# Patient Record
Sex: Male | Born: 1965 | Race: White | Hispanic: No | Marital: Married | Smoking: Never smoker
Health system: Southern US, Community
[De-identification: ages and names within clinical notes are randomized; demographics above are authoritative.]

## PROBLEM LIST (undated history)

## (undated) DIAGNOSIS — L219 Seborrheic dermatitis, unspecified: Secondary | ICD-10-CM

## (undated) DIAGNOSIS — T783XXA Angioneurotic edema, initial encounter: Secondary | ICD-10-CM

## (undated) DIAGNOSIS — K862 Cyst of pancreas: Secondary | ICD-10-CM

## (undated) DIAGNOSIS — H9319 Tinnitus, unspecified ear: Secondary | ICD-10-CM

## (undated) DIAGNOSIS — K449 Diaphragmatic hernia without obstruction or gangrene: Secondary | ICD-10-CM

## (undated) HISTORY — DX: Seborrheic dermatitis, unspecified: L21.9

## (undated) HISTORY — DX: Diaphragmatic hernia without obstruction or gangrene: K44.9

## (undated) HISTORY — DX: Tinnitus, unspecified ear: H93.19

## (undated) HISTORY — DX: Cyst of pancreas: K86.2

## (undated) HISTORY — PX: OTHER SURGICAL HISTORY: SHX169

## (undated) HISTORY — DX: Angioneurotic edema, initial encounter: T78.3XXA

---
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
## 1996-06-19 HISTORY — PX: LAPAROSCOPIC NISSEN FUNDOPLICATION: SHX1932

## 1997-06-19 HISTORY — PX: VASECTOMY: SHX75

## 2020-02-26 ENCOUNTER — Other Ambulatory Visit: Payer: Self-pay

## 2020-02-26 ENCOUNTER — Encounter (HOSPITAL_COMMUNITY): Payer: Self-pay | Admitting: Emergency Medicine

## 2020-02-26 ENCOUNTER — Emergency Department (HOSPITAL_COMMUNITY)

## 2020-02-26 ENCOUNTER — Inpatient Hospital Stay (HOSPITAL_COMMUNITY)
Admission: EM | Admit: 2020-02-26 | Discharge: 2020-02-29 | DRG: 418 | Disposition: A | Discharge status: Home / Self Care | Attending: Surgery | Admitting: Surgery

## 2020-02-26 DIAGNOSIS — K219 Gastro-esophageal reflux disease without esophagitis: Secondary | ICD-10-CM | POA: Diagnosis present

## 2020-02-26 DIAGNOSIS — K449 Diaphragmatic hernia without obstruction or gangrene: Secondary | ICD-10-CM | POA: Diagnosis present

## 2020-02-26 DIAGNOSIS — I10 Essential (primary) hypertension: Secondary | ICD-10-CM | POA: Diagnosis not present

## 2020-02-26 DIAGNOSIS — Z20822 Contact with and (suspected) exposure to covid-19: Secondary | ICD-10-CM | POA: Diagnosis present

## 2020-02-26 DIAGNOSIS — K760 Fatty (change of) liver, not elsewhere classified: Secondary | ICD-10-CM | POA: Diagnosis not present

## 2020-02-26 DIAGNOSIS — K660 Peritoneal adhesions (postprocedural) (postinfection): Secondary | ICD-10-CM | POA: Diagnosis not present

## 2020-02-26 DIAGNOSIS — K8050 Calculus of bile duct without cholangitis or cholecystitis without obstruction: Secondary | ICD-10-CM

## 2020-02-26 DIAGNOSIS — K810 Acute cholecystitis: Secondary | ICD-10-CM

## 2020-02-26 DIAGNOSIS — Z419 Encounter for procedure for purposes other than remedying health state, unspecified: Secondary | ICD-10-CM

## 2020-02-26 DIAGNOSIS — R109 Unspecified abdominal pain: Secondary | ICD-10-CM

## 2020-02-26 DIAGNOSIS — Z880 Allergy status to penicillin: Secondary | ICD-10-CM

## 2020-02-26 DIAGNOSIS — K8062 Calculus of gallbladder and bile duct with acute cholecystitis without obstruction: Principal | ICD-10-CM | POA: Diagnosis present

## 2020-02-26 DIAGNOSIS — K862 Cyst of pancreas: Secondary | ICD-10-CM | POA: Diagnosis not present

## 2020-02-26 DIAGNOSIS — K8000 Calculus of gallbladder with acute cholecystitis without obstruction: Secondary | ICD-10-CM | POA: Diagnosis present

## 2020-02-26 DIAGNOSIS — K819 Cholecystitis, unspecified: Secondary | ICD-10-CM | POA: Diagnosis present

## 2020-02-26 DIAGNOSIS — R1011 Right upper quadrant pain: Secondary | ICD-10-CM

## 2020-02-26 LAB — COMPREHENSIVE METABOLIC PANEL
ALT: 18 U/L (ref 0–44)
AST: 12 U/L — ABNORMAL LOW (ref 15–41)
Albumin: 3.7 g/dL (ref 3.5–5.0)
Alkaline Phosphatase: 70 U/L (ref 38–126)
Anion gap: 11 (ref 5–15)
BUN: 16 mg/dL (ref 6–20)
CO2: 24 mmol/L (ref 22–32)
Calcium: 9 mg/dL (ref 8.9–10.3)
Chloride: 98 mmol/L (ref 98–111)
Creatinine, Ser: 0.87 mg/dL (ref 0.61–1.24)
GFR calc Af Amer: >60 mL/min (ref >60)
GFR calc non Af Amer: >60 mL/min (ref >60)
Glucose, Bld: 101 mg/dL — ABNORMAL HIGH (ref 70–99)
Potassium: 3.7 mmol/L (ref 3.5–5.1)
Sodium: 133 mmol/L — ABNORMAL LOW (ref 135–145)
Total Bilirubin: 1.6 mg/dL — ABNORMAL HIGH (ref 0.3–1.2)
Total Protein: 7.4 g/dL (ref 6.5–8.1)

## 2020-02-26 LAB — LACTIC ACID, PLASMA
Lactic Acid, Venous: 1.4 mmol/L (ref 0.5–1.9)
Lactic Acid, Venous: 1 mmol/L (ref 0.5–1.9)

## 2020-02-26 LAB — URINALYSIS, ROUTINE W REFLEX MICROSCOPIC
Bilirubin Urine: NEGATIVE
Glucose, UA: NEGATIVE mg/dL
Hgb urine dipstick: NEGATIVE
Ketones, ur: 5 mg/dL — AB
Leukocytes,Ua: NEGATIVE
Nitrite: NEGATIVE
Protein, ur: 30 mg/dL — AB
Specific Gravity, Urine: 1.026 (ref 1.005–1.030)
pH: 5 (ref 5.0–8.0)

## 2020-02-26 LAB — CBC
HCT: 47.6 % (ref 39.0–52.0)
Hemoglobin: 16.9 g/dL (ref 13.0–17.0)
MCH: 33.3 pg (ref 26.0–34.0)
MCHC: 35.5 g/dL (ref 30.0–36.0)
MCV: 93.9 fL (ref 80.0–100.0)
Platelets: 279 10*3/uL (ref 150–400)
RBC: 5.07 MIL/uL (ref 4.22–5.81)
RDW: 12.5 % (ref 11.5–15.5)
WBC: 13.2 10*3/uL — ABNORMAL HIGH (ref 4.0–10.5)
nRBC: 0 % (ref 0.0–0.2)

## 2020-02-26 LAB — PROTIME-INR
INR: 1.1 (ref 0.8–1.2)
Prothrombin Time: 13.6 seconds (ref 11.4–15.2)

## 2020-02-26 LAB — HIV ANTIBODY (ROUTINE TESTING W REFLEX): HIV Screen 4th Generation wRfx: NONREACTIVE

## 2020-02-26 LAB — SURGICAL PCR SCREEN
MRSA, PCR: NEGATIVE
Staphylococcus aureus: NEGATIVE

## 2020-02-26 LAB — APTT: aPTT: 29 seconds (ref 24–36)

## 2020-02-26 LAB — SARS CORONAVIRUS 2 BY RT PCR (HOSPITAL ORDER, PERFORMED IN ~~LOC~~ HOSPITAL LAB): SARS Coronavirus 2: NEGATIVE

## 2020-02-26 LAB — LIPASE, BLOOD: Lipase: 20 U/L (ref 11–51)

## 2020-02-26 MED ORDER — LORATADINE 10 MG PO TABS
10.0000 mg | ORAL_TABLET | Freq: Every day | ORAL | Status: DC
  Administered 2020-02-27: 10 mg via ORAL
  Filled 2020-02-26: qty 1

## 2020-02-26 MED ORDER — LORATADINE 10 MG PO TABS: 10.0000 mg | ORAL_TABLET | Freq: Every day | ORAL | Status: DC

## 2020-02-26 MED ORDER — HYDRALAZINE HCL 10 MG PO TABS
10.0000 mg | ORAL_TABLET | Freq: Four times a day (QID) | ORAL | Status: DC | PRN
  Filled 2020-02-26: qty 1

## 2020-02-26 MED ORDER — IBUPROFEN 400 MG PO TABS: 600.0000 mg | ORAL_TABLET | Freq: Four times a day (QID) | ORAL | Status: DC | PRN

## 2020-02-26 MED ORDER — DIPHENHYDRAMINE HCL 50 MG/ML IJ SOLN: 25.0000 mg | Freq: Four times a day (QID) | INTRAMUSCULAR | Status: DC | PRN

## 2020-02-26 MED ORDER — OXYCODONE HCL 5 MG PO TABS
5.0000 mg | ORAL_TABLET | Freq: Four times a day (QID) | ORAL | Status: DC | PRN
  Administered 2020-02-27: 5 mg via ORAL
  Filled 2020-02-26: qty 1

## 2020-02-26 MED ORDER — SODIUM CHLORIDE 0.9 % IV SOLN
2.0000 g | Freq: Once | INTRAVENOUS | Status: AC
  Administered 2020-02-26: 2 g via INTRAVENOUS
  Filled 2020-02-26: qty 20

## 2020-02-26 MED ORDER — MONTELUKAST SODIUM 10 MG PO TABS: 10.0000 mg | ORAL_TABLET | Freq: Every day | ORAL | Status: DC

## 2020-02-26 MED ORDER — KCL IN DEXTROSE-NACL 20-5-0.45 MEQ/L-%-% IV SOLN
INTRAVENOUS | Status: DC
  Filled 2020-02-26 (×3): qty 1000

## 2020-02-26 MED ORDER — ENOXAPARIN SODIUM 40 MG/0.4ML ~~LOC~~ SOLN: 40.0000 mg | SUBCUTANEOUS | Status: DC

## 2020-02-26 MED ORDER — ACETAMINOPHEN 325 MG PO TABS
650.0000 mg | ORAL_TABLET | Freq: Four times a day (QID) | ORAL | Status: DC | PRN
  Administered 2020-02-27: 650 mg via ORAL
  Filled 2020-02-26: qty 2

## 2020-02-26 MED ORDER — SODIUM CHLORIDE 0.9 % IV SOLN
2.0000 g | INTRAVENOUS | Status: DC
  Filled 2020-02-26: qty 20

## 2020-02-26 MED ORDER — MUPIROCIN 2 % EX OINT: 1.0000 "application " | TOPICAL_OINTMENT | Freq: Two times a day (BID) | CUTANEOUS | Status: DC

## 2020-02-26 MED ORDER — MORPHINE SULFATE (PF) 4 MG/ML IV SOLN
4.0000 mg | INTRAVENOUS | Status: DC | PRN
  Administered 2020-02-26: 4 mg via INTRAVENOUS
  Filled 2020-02-26: qty 1

## 2020-02-26 MED ORDER — SODIUM CHLORIDE 0.9 % IV BOLUS (SEPSIS)
1000.0000 mL | Freq: Once | INTRAVENOUS | Status: AC
  Administered 2020-02-26: 1000 mL via INTRAVENOUS

## 2020-02-26 MED ORDER — DIPHENHYDRAMINE HCL 25 MG PO CAPS: 25.0000 mg | ORAL_CAPSULE | Freq: Four times a day (QID) | ORAL | Status: DC | PRN

## 2020-02-26 MED ORDER — ACETAMINOPHEN 650 MG RE SUPP: 650.0000 mg | Freq: Four times a day (QID) | RECTAL | Status: DC | PRN

## 2020-02-26 MED ORDER — SODIUM CHLORIDE 0.9 % IV SOLN: 2.0000 g | INTRAVENOUS | Status: DC

## 2020-02-26 MED ORDER — ONDANSETRON HCL 4 MG/2ML IJ SOLN: 4.0000 mg | Freq: Four times a day (QID) | INTRAMUSCULAR | Status: DC | PRN

## 2020-02-26 MED ORDER — ONDANSETRON 4 MG PO TBDP: 4.0000 mg | ORAL_TABLET | Freq: Four times a day (QID) | ORAL | Status: DC | PRN

## 2020-02-26 NOTE — Anesthesia Preprocedure Evaluation (Addendum)
Anesthesia Evaluation  Patient identified by MRN, date of birth, ID band Patient awake    Reviewed: Allergy & Precautions, NPO status , Patient's Chart, lab work & pertinent test results  History of Anesthesia Complications Negative for: history of anesthetic complications  Airway Mallampati: II  TM Distance: >3 FB Neck ROM: Full    Dental no notable dental hx. (+) Dental Advisory Given   Pulmonary neg pulmonary ROS,    Pulmonary exam normal        Cardiovascular hypertension, Normal cardiovascular exam     Neuro/Psych negative neurological ROS     GI/Hepatic Neg liver ROS,   Endo/Other  negative endocrine ROS  Renal/GU negative Renal ROS     Musculoskeletal negative musculoskeletal ROS (+)   Abdominal   Peds  Hematology negative hematology ROS (+)   Anesthesia Other Findings   Reproductive/Obstetrics                            Anesthesia Physical Anesthesia Plan  ASA: II  Anesthesia Plan: General   Post-op Pain Management:    Induction: Intravenous  PONV Risk Score and Plan: 4 or greater and Ondansetron, Dexamethasone, Midazolam and Treatment may vary due to age or medical condition  Airway Management Planned: Oral ETT  Additional Equipment:   Intra-op Plan:   Post-operative Plan: Extubation in OR  Informed Consent: I have reviewed the patients History and Physical, chart, labs and discussed the procedure including the risks, benefits and alternatives for the proposed anesthesia with the patient or authorized representative who has indicated his/her understanding and acceptance.     Dental advisory given  Plan Discussed with: Anesthesiologist, CRNA and Surgeon  Anesthesia Plan Comments:        Anesthesia Quick Evaluation

## 2020-02-26 NOTE — ED Provider Notes (Signed)
[facility]-EMERGENCY DEPT Provider Note   CSN: [id number] Arrival date & time: 02/26/20  1856     History Chief Complaint  Patient presents with  . Abdominal Pain    Tim Russo is a 54 y.o. male.  The history is provided by the patient. No language interpreter was used.  Abdominal Pain  Tim Russo is a 54 y.o. male who presents to the Emergency Department complaining of abdominal pain. 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 On day one of symptoms pain was severe, crampy and generalized. He had associated diaphoresis, vomiting and diarrhea. The following day he had significant fatigue and pain localized to his epigastrum. He had ongoing pain that was slightly improving over the next few days until last night when he developed worsening epigastric pain with diaphoresis, chills. This morning he developed a fever to 101.2. He has nausea. Pain is worse with sitting up and movement. Pain is also worse with meals. No reports of cough, shortness of breath, chest pain, dysuria. He has a history of hypertension, hiatal hernia and seasonal allergies. He uses occasional alcohol. No known sick contacts. He has been fully vaccinated for COVID-19.    History reviewed. No pertinent past medical history. Hx/o HTN Patient Active Problem List   Diagnosis Date Noted  . Cholecystitis 02/26/2020        History reviewed. No pertinent family history.  Social History   Tobacco Use  . Smoking status: Not on file  Substance Use Topics  . Alcohol use: Not on file  . Drug use: Not on file    Home Medications Prior to Admission medications   Medication Sig Start Date End Date Taking? Authorizing Provider  acetaminophen (TYLENOL) 500 MG tablet Take 500 mg by mouth 4 (four) times daily as needed for mild pain, fever or headache.   Yes [provider]  cetirizine (ZYRTEC) 10 MG tablet Take 10 mg by mouth daily.   Yes  [provider]  montelukast (SINGULAIR) 10 MG tablet Take 10 mg by mouth daily. 01/25/20  Yes [provider]    Allergies    Penicillins  Review of Systems   Review of Systems  Gastrointestinal: Positive for abdominal pain.  All other systems reviewed and are negative.   Physical Exam Updated Vital Signs BP 133/82   Pulse 76   Temp 99.8 F (37.7 C) (Oral)   Resp 14   Ht 5\' 8"  (1.727 m)   Wt 74.8 kg   SpO2 99%   BMI 25.09 kg/m   Physical Exam Vitals and nursing note reviewed.  Constitutional:      Appearance: He is well-developed.  HENT:     Head: Normocephalic and atraumatic.  Cardiovascular:     Rate and Rhythm: Normal rate and regular rhythm.     Heart sounds: No murmur heard.   Pulmonary:     Effort: Pulmonary effort is normal. No respiratory distress.     Breath sounds: Normal breath sounds.  Abdominal:     Palpations: Abdomen is soft.     Tenderness: There is no guarding or rebound.     Comments: Moderate RUQ tenderness, positive murphys  Musculoskeletal:        General: No swelling or tenderness.  Skin:    General: Skin is warm and dry.  Neurological:     Mental Status: He is alert and oriented to person, place, and time.  Psychiatric:  Behavior: Behavior normal.     ED Results / Procedures / Treatments   Labs (all labs ordered are listed, but only abnormal results are displayed) Labs Reviewed  COMPREHENSIVE METABOLIC PANEL - Abnormal; Notable for the following components:      Result Value   Sodium 133 (*)    Glucose, Bld 101 (*)    AST 12 (*)    Total Bilirubin 1.6 (*)    All other components within normal limits  CBC - Abnormal; Notable for the following components:   WBC 13.2 (*)    All other components within normal limits  URINALYSIS, ROUTINE W REFLEX MICROSCOPIC - Abnormal; Notable for the following components:   Color, Urine AMBER (*)    Ketones, ur 5 (*)    Protein, ur 30 (*)    Bacteria, UA RARE (*)     All other components within normal limits  SARS CORONAVIRUS 2 BY RT PCR (HOSPITAL ORDER, PERFORMED IN [facility])  CULTURE, BLOOD (SINGLE)  LIPASE, BLOOD  LACTIC ACID, PLASMA  LACTIC ACID, PLASMA  PROTIME-INR  APTT  HIV ANTIBODY (ROUTINE TESTING W REFLEX)  COMPREHENSIVE METABOLIC PANEL  CBC    EKG EKG Interpretation  Date/Time:  Thursday February 26 2020 12:08:04 EDT Ventricular Rate:  77 PR Interval:    QRS Duration: 88 QT Interval:  359 QTC Calculation: 407 R Axis:   20 Text Interpretation: Sinus rhythm Consider left atrial enlargement Confirmed by Tilden Fossa [id number]) on 02/26/2020 12:11:18 PM   Radiology DG Chest Port 1 View  Result Date: 02/26/2020 CLINICAL DATA:  Abdominal pain after eating, since then having abdominal cramping, fatigue, loss of appetite, sweats and chills. Fever up to 101. Question sepsis. EXAM: PORTABLE CHEST 1 VIEW COMPARISON:  None. FINDINGS: The heart size and mediastinal contours are within normal limits. No focal consolidation. No pulmonary edema. No pleural effusion. No pneumothorax. No acute osseous abnormality. IMPRESSION: No active cardiopulmonary disease. Electronically Signed   By: Tish Frederickson M.D.   On: 02/26/2020 12:48   US Abdomen Limited RUQ  Result Date: 02/26/2020 CLINICAL DATA:  Right upper quadrant pain. EXAM: ULTRASOUND ABDOMEN LIMITED RIGHT UPPER QUADRANT COMPARISON:  No prior. FINDINGS: Gallbladder: Sludge is noted the gallbladder. Gallbladder wall is significantly thickened at 8 mm. Positive Murphy sign. Cholecystitis could present in this fashion. Common bile duct: Diameter: 4.3 mm Liver: Increased echogenicity consistent fatty infiltration or hepatocellular disease. Portal vein is patent on color Doppler imaging with normal direction of blood flow towards the liver. Other: None. IMPRESSION: 1. Gallbladder sludge is noted. Gallbladder wall is significantly thickened at 8 mm. Positive Murphy sign. Cholecystitis could  present this fashion. 2. Increased hepatic echogenicity consistent fatty infiltration or hepatocellular disease. Electronically Signed   By: Maisie Fus  Register   On: 02/26/2020 13:40    Procedures Procedures (including critical care time)  Medications Ordered in ED Medications  enoxaparin (LOVENOX) injection 40 mg (has no administration in time range)  dextrose 5 % and 0.45 % NaCl with KCl 20 mEq/L infusion ( Intravenous New Bag/Given 02/26/20 1617)  acetaminophen (TYLENOL) tablet 650 mg (has no administration in time range)    Or  acetaminophen (TYLENOL) suppository 650 mg (has no administration in time range)  ibuprofen (ADVIL) tablet 600 mg (has no administration in time range)  oxyCODONE (Oxy IR/ROXICODONE) immediate release tablet 5 mg (has no administration in time range)  morphine 4 MG/ML injection 4 mg (has no administration in time range)  diphenhydrAMINE (BENADRYL) capsule 25 mg (has  no administration in time range)    Or  diphenhydrAMINE (BENADRYL) injection 25 mg (has no administration in time range)  ondansetron (ZOFRAN-ODT) disintegrating tablet 4 mg (has no administration in time range)    Or  ondansetron (ZOFRAN) injection 4 mg (has no administration in time range)  hydrALAZINE (APRESOLINE) tablet 10 mg (has no administration in time range)  cefTRIAXone (ROCEPHIN) 2 g in sodium chloride 0.9 % 100 mL IVPB (has no administration in time range)  loratadine (CLARITIN) tablet 10 mg (has no administration in time range)  montelukast (SINGULAIR) tablet 10 mg (has no administration in time range)  sodium chloride 0.9 % bolus 1,000 mL (0 mLs Intravenous Stopped 02/26/20 1403)  cefTRIAXone (ROCEPHIN) 2 g in sodium chloride 0.9 % 100 mL IVPB (0 g Intravenous Stopped 02/26/20 1433)    ED Course  I have reviewed the triage vital signs and the nursing notes.  Pertinent labs & imaging results that were available during my care of the patient were reviewed by me and considered in my medical  decision making (see chart for details).    MDM Rules/Calculators/A&P                         Patient here for evaluation of abdominal pain, fevers. He is non-toxic appearing on evaluation. He does have a positive Murphy sign. Labs with leukocytosis. Right upper quadrant ultrasound obtained, which demonstrates acute cholecystitis. He was treated with antibiotics. Discussed with patient findings of studies recommendation for admission and he is in agreement treatment plan. General surgery consulted for admission.  Final Clinical Impression(s) / ED Diagnoses Final diagnoses:  RUQ abdominal pain  Acute cholecystitis    Rx / DC Orders ED Discharge Orders    None       Tilden Fossa, MD 02/26/20 1728

## 2020-02-26 NOTE — H&P (Addendum)
[facility] Admission Note  Tim Russo 12-Feb-1966  [id number].    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 Patient states the pain started on 9/4 after dinner described as constant abdominal pain that moved all over his abdomen. associated sxs include low volume emesis, watery diarrhea, and night sweats. He felt slightly better 9/5-9/7 but reports mostly drinking liquids and eating a small amt soup, small scone. On 9/8 he decided to try to eat a normal dinner, chorizo taco blue apron meal, and this caused recurrence of severe abdominal pain, nausea, night sweats, and fever (101F taken at home) so he decided to come to the ED for evaluation. He denies use of blood thinning medications. He has a listed allergy to PCN which causes rash. 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  ROS: Review of Systems  Constitutional: Positive for diaphoresis and fever.  Gastrointestinal: Positive for abdominal pain, diarrhea, nausea and vomiting.  All other systems reviewed and are negative.  History reviewed. No pertinent family history.  History reviewed. No pertinent past medical history.  Social History:  has no history on file for tobacco use, alcohol use, and drug use.  Allergies:  Allergies  Allergen Reactions   Penicillins Rash    (Not in a hospital admission)   Blood pressure 139/90, pulse 79, temperature 99.8 F (37.7 C), temperature source Oral, resp. rate 12, height 5\' 8"  (1.727 m), weight 74.8 kg, SpO2 98 %. Physical Exam: Constitutional: NAD; conversant; no deformities Eyes: Moist conjunctiva; no lid lag; anicteric; PERRL Neck: Trachea midline; no thyromegaly Lungs: Normal  respiratory effort; CTAB anterior lung fields CV: RRR; no m/r/g; no pitting edema, pedal pulses 2+ bilaterally GI: Abd soft; TTP RUQ with guarding, + Murphy's sign, no peritonitis, no palpable hepatosplenomegaly MSK: symmetrical; no clubbing/cyanosis Psychiatric: Appropriate affect; alert and oriented x3 Skin: warm and dry without masses, lesions, or cellulitis   Results for orders placed or performed during the hospital encounter of 02/26/20 (from the past 48 hour(s))  Urinalysis, Routine w reflex microscopic Urine, Clean Catch     Status: Abnormal   Collection Time: 02/26/20 11:46 AM  Result Value Ref Range   Color, Urine AMBER (A) YELLOW    Comment: BIOCHEMICALS MAY BE AFFECTED BY COLOR   APPearance CLEAR CLEAR   Specific Gravity, Urine 1.026 1.005 - 1.030   pH 5.0 5.0 - 8.0   Glucose, UA NEGATIVE NEGATIVE mg/dL   Hgb urine dipstick NEGATIVE NEGATIVE   Bilirubin Urine NEGATIVE NEGATIVE   Ketones, ur 5 (A) NEGATIVE mg/dL   Protein, ur 30 (A) NEGATIVE mg/dL   Nitrite NEGATIVE NEGATIVE   Leukocytes,Ua NEGATIVE NEGATIVE   RBC / HPF 0-5 0 - 5 RBC/hpf   WBC, UA 0-5 0 - 5 WBC/hpf   Bacteria, UA RARE (A) NONE SEEN   Squamous Epithelial / LPF 0-5 0 - 5   Mucus PRESENT     Comment: Performed at [facility], [address], [place], [place]  Lipase, blood     Status: None   Collection Time: 02/26/20 12:45 PM  Result Value Ref Range   Lipase 20 11 - 51 U/L    Comment: Performed at  [facility], [address] [place], [place] [address]  Comprehensive metabolic panel     Status: Abnormal   Collection Time: 02/26/20 12:45 PM  Result Value Ref Range   Sodium 133 (L) 135 - 145 mmol/L   Potassium 3.7 3.5 - 5.1 mmol/L   Chloride 98 98 - 111 mmol/L   CO2 24 22 - 32 mmol/L   Glucose, Bld 101 (H) 70 - 99 mg/dL    Comment: Glucose reference range applies only to samples taken after fasting for at least 8 hours.   BUN 16 6 - 20 mg/dL    Creatinine, Ser 3.00 0.61 - 1.24 mg/dL   Calcium 9.0 8.9 - 92.3 mg/dL   Total Protein 7.4 6.5 - 8.1 g/dL   Albumin 3.7 3.5 - 5.0 g/dL   AST 12 (L) 15 - 41 U/L   ALT 18 0 - 44 U/L   Alkaline Phosphatase 70 38 - 126 U/L   Total Bilirubin 1.6 (H) 0.3 - 1.2 mg/dL   GFR calc non Af Amer >60 >60 mL/min   GFR calc Af Amer >60 >60 mL/min   Anion gap 11 5 - 15    Comment: Performed at [facility], [address], [place], [place] [address]  CBC     Status: Abnormal   Collection Time: 02/26/20 12:45 PM  Result Value Ref Range   WBC 13.2 (H) 4.0 - 10.5 K/uL   RBC 5.07 4.22 - 5.81 MIL/uL   Hemoglobin 16.9 13.0 - 17.0 g/dL   HCT 22.6 39 - 52 %   MCV 93.9 80.0 - 100.0 fL   MCH 33.3 26.0 - 34.0 pg   MCHC 35.5 30.0 - 36.0 g/dL   RDW 33.3 54.5 - 62.5 %   Platelets 279 150 - 400 K/uL   nRBC 0.0 0.0 - 0.2 %    Comment: Performed at [facility], [address] [place], [place] [address]  Lactic acid, plasma     Status: None   Collection Time: 02/26/20 12:45 PM  Result Value Ref Range   Lactic Acid, Venous 1.4 0.5 - 1.9 mmol/L    Comment: Performed at [facility], [address] [place], [place] [address]  Protime-INR     Status: None   Collection Time: 02/26/20 12:45 PM  Result Value Ref Range   Prothrombin Time 13.6 11.4 - 15.2 seconds   INR 1.1 0.8 - 1.2    Comment: (NOTE) INR goal varies based on device and disease states. Performed at [facility], [address], [place], [place] [address]   APTT     Status: None   Collection Time: 02/26/20 12:45 PM  Result Value Ref Range   aPTT 29 24 - 36 seconds    Comment: Performed at [facility], [address] [place], [place] [address]  SARS Coronavirus 2 by RT PCR (hospital order, performed in [facility] hospital lab) Nasopharyngeal Nasopharyngeal Swab     Status: None   Collection Time: 02/26/20 12:54 PM   Specimen: Nasopharyngeal Swab   Result Value Ref Range   SARS Coronavirus 2 NEGATIVE NEGATIVE    Comment: (NOTE) SARS-CoV-2 target nucleic acids are NOT DETECTED.  The SARS-CoV-2 RNA is generally detectable in upper and lower respiratory specimens during the acute phase of infection. The lowest concentration of SARS-CoV-2 viral copies this assay can detect is 250 copies / mL. A negative result does not preclude SARS-CoV-2 infection and should  not be used as the sole basis for treatment or other patient management decisions.  A negative result may occur with improper specimen collection / handling, submission of specimen other than nasopharyngeal swab, presence of viral mutation(s) within the areas targeted by this assay, and inadequate number of viral copies (<250 copies / mL). A negative result must be combined with clinical observations, patient history, and epidemiological information.  Fact Sheet for Patients:   BoilerBrush.com.cy  Fact Sheet for Healthcare Providers: https://pope.com/  This test is not yet approved or  cleared by the [place] FDA and has been authorized for detection and/or diagnosis of SARS-CoV-2 by FDA under an Emergency Use Authorization (EUA).  This EUA will remain in effect (meaning this test can be used) for the duration of the COVID-19 declaration under Section 564(b)(1) of the Act, 21 U.S.C. section 360bbb-3(b)(1), unless the authorization is terminated or revoked sooner.  Performed at [facility], [address] [place], [place] [address]    DG Chest Port 1 View  Result Date: 02/26/2020 CLINICAL DATA:  Abdominal pain after eating, since then having abdominal cramping, fatigue, loss of appetite, sweats and chills. Fever up to 101. Question sepsis. EXAM: PORTABLE CHEST 1 VIEW COMPARISON:  None. FINDINGS: The heart size and mediastinal contours are within normal limits. No focal consolidation. No pulmonary  edema. No pleural effusion. No pneumothorax. No acute osseous abnormality. IMPRESSION: No active cardiopulmonary disease. Electronically Signed   By: Tish Frederickson M.D.   On: 02/26/2020 12:48   US Abdomen Limited RUQ  Result Date: 02/26/2020 CLINICAL DATA:  Right upper quadrant pain. EXAM: ULTRASOUND ABDOMEN LIMITED RIGHT UPPER QUADRANT COMPARISON:  No prior. FINDINGS: Gallbladder: Sludge is noted the gallbladder. Gallbladder wall is significantly thickened at 8 mm. Positive Murphy sign. Cholecystitis could present in this fashion. Common bile duct: Diameter: 4.3 mm Liver: Increased echogenicity consistent fatty infiltration or hepatocellular disease. Portal vein is patent on color Doppler imaging with normal direction of blood flow towards the liver. Other: None. IMPRESSION: 1. Gallbladder sludge is noted. Gallbladder wall is significantly thickened at 8 mm. Positive Murphy sign. Cholecystitis could present this fashion. 2. Increased hepatic echogenicity consistent fatty infiltration or hepatocellular disease. Electronically Signed   By: Maisie Fus  Register   On: 02/26/2020 13:40    Assessment/Plan HTN - PRN hydralazine, previously took lisinopril but stopped b/c caused cough. In the process of getting established with a local PCP Hiatal hernia  Allergies with history of allergic angioedema - re-order home Singulair, we do not have Zyrtec on formulary so I will order Claritin.   Acute cholecystitis  Hyperbilirubinemia (1.6) Leukocytosis  - afebrile currently, vitals signs stable, WBC 13 - RUQ U/S and physical exam are consistent with acute cholecystitis  - recommend IV antibiotics and admission for laparoscopic cholecystectomy with IOC  FEN: CLD, NPO after MN, IVF @ 100 cc/hr  ID: Rocephin 9/9>> VTE: SCD's, Lovenox  Dispo: admit to [facility] service, Med-Surg, lap chole tomorrow   Anticipate hospital discharge in 24-48 hours   Adam Phenix, [facility] Please see Amion  for pager number during day hours 7:00am-4:30pm 02/26/2020, 3:16 PM

## 2020-02-26 NOTE — ED Triage Notes (Signed)
Sept 4th, Pt developed abd pain after eating dinner. Ever since he has been having abd cramping, fatigue, loss of appetite, and sweats and chills. States he had a fever of 101 at home.

## 2020-02-27 ENCOUNTER — Encounter (HOSPITAL_COMMUNITY): Admission: EM | Disposition: A | Discharge status: Home / Self Care | Payer: Self-pay | Source: Home / Self Care

## 2020-02-27 ENCOUNTER — Observation Stay (HOSPITAL_COMMUNITY)

## 2020-02-27 ENCOUNTER — Encounter (HOSPITAL_COMMUNITY): Payer: Self-pay

## 2020-02-27 ENCOUNTER — Observation Stay (HOSPITAL_COMMUNITY): Admitting: Anesthesiology

## 2020-02-27 DIAGNOSIS — K449 Diaphragmatic hernia without obstruction or gangrene: Secondary | ICD-10-CM | POA: Diagnosis present

## 2020-02-27 DIAGNOSIS — R109 Unspecified abdominal pain: Secondary | ICD-10-CM | POA: Diagnosis present

## 2020-02-27 DIAGNOSIS — K862 Cyst of pancreas: Secondary | ICD-10-CM | POA: Diagnosis present

## 2020-02-27 DIAGNOSIS — Z20822 Contact with and (suspected) exposure to covid-19: Secondary | ICD-10-CM | POA: Diagnosis present

## 2020-02-27 DIAGNOSIS — K660 Peritoneal adhesions (postprocedural) (postinfection): Secondary | ICD-10-CM | POA: Diagnosis present

## 2020-02-27 DIAGNOSIS — Z880 Allergy status to penicillin: Secondary | ICD-10-CM | POA: Diagnosis not present

## 2020-02-27 DIAGNOSIS — K8062 Calculus of gallbladder and bile duct with acute cholecystitis without obstruction: Secondary | ICD-10-CM | POA: Diagnosis present

## 2020-02-27 DIAGNOSIS — K219 Gastro-esophageal reflux disease without esophagitis: Secondary | ICD-10-CM | POA: Diagnosis present

## 2020-02-27 DIAGNOSIS — K760 Fatty (change of) liver, not elsewhere classified: Secondary | ICD-10-CM | POA: Diagnosis present

## 2020-02-27 DIAGNOSIS — K8000 Calculus of gallbladder with acute cholecystitis without obstruction: Secondary | ICD-10-CM | POA: Diagnosis present

## 2020-02-27 DIAGNOSIS — I10 Essential (primary) hypertension: Secondary | ICD-10-CM | POA: Diagnosis present

## 2020-02-27 HISTORY — PX: CHOLECYSTECTOMY: SHX55

## 2020-02-27 LAB — CBC
HCT: 41.3 % (ref 39.0–52.0)
Hemoglobin: 13.9 g/dL (ref 13.0–17.0)
MCH: 32.6 pg (ref 26.0–34.0)
MCHC: 33.7 g/dL (ref 30.0–36.0)
MCV: 96.9 fL (ref 80.0–100.0)
Platelets: 215 10*3/uL (ref 150–400)
RBC: 4.26 MIL/uL (ref 4.22–5.81)
RDW: 12.5 % (ref 11.5–15.5)
WBC: 8.3 10*3/uL (ref 4.0–10.5)
nRBC: 0 % (ref 0.0–0.2)

## 2020-02-27 LAB — COMPREHENSIVE METABOLIC PANEL
ALT: 152 U/L — ABNORMAL HIGH (ref 0–44)
AST: 169 U/L — ABNORMAL HIGH (ref 15–41)
Albumin: 2.8 g/dL — ABNORMAL LOW (ref 3.5–5.0)
Alkaline Phosphatase: 192 U/L — ABNORMAL HIGH (ref 38–126)
Anion gap: 7 (ref 5–15)
BUN: 12 mg/dL (ref 6–20)
CO2: 24 mmol/L (ref 22–32)
Calcium: 8.3 mg/dL — ABNORMAL LOW (ref 8.9–10.3)
Chloride: 103 mmol/L (ref 98–111)
Creatinine, Ser: 0.73 mg/dL (ref 0.61–1.24)
GFR calc Af Amer: >60 mL/min (ref >60)
GFR calc non Af Amer: >60 mL/min (ref >60)
Glucose, Bld: 117 mg/dL — ABNORMAL HIGH (ref 70–99)
Potassium: 4 mmol/L (ref 3.5–5.1)
Sodium: 134 mmol/L — ABNORMAL LOW (ref 135–145)
Total Bilirubin: 5.4 mg/dL — ABNORMAL HIGH (ref 0.3–1.2)
Total Protein: 6 g/dL — ABNORMAL LOW (ref 6.5–8.1)

## 2020-02-27 SURGERY — LAPAROSCOPIC CHOLECYSTECTOMY WITH INTRAOPERATIVE CHOLANGIOGRAM
Anesthesia: General | Site: Abdomen

## 2020-02-27 MED ORDER — PROMETHAZINE HCL 25 MG/ML IJ SOLN: 6.2500 mg | INTRAMUSCULAR | Status: DC | PRN

## 2020-02-27 MED ORDER — HYDROMORPHONE HCL 1 MG/ML IJ SOLN
1.0000 mg | INTRAMUSCULAR | Status: DC | PRN
  Administered 2020-02-27 – 2020-02-28 (×3): 1 mg via INTRAVENOUS
  Filled 2020-02-27 (×3): qty 1

## 2020-02-27 MED ORDER — BUPIVACAINE-EPINEPHRINE 0.5% -1:200000 IJ SOLN
INTRAMUSCULAR | Status: DC | PRN
  Administered 2020-02-27: 30 mL

## 2020-02-27 MED ORDER — ROCURONIUM BROMIDE 10 MG/ML (PF) SYRINGE
PREFILLED_SYRINGE | INTRAVENOUS | Status: AC
  Filled 2020-02-27: qty 10

## 2020-02-27 MED ORDER — SODIUM CHLORIDE 0.45 % IV SOLN: INTRAVENOUS | Status: DC

## 2020-02-27 MED ORDER — PROPOFOL 10 MG/ML IV BOLUS
INTRAVENOUS | Status: DC | PRN
  Administered 2020-02-27: 200 mg via INTRAVENOUS

## 2020-02-27 MED ORDER — ONDANSETRON HCL 4 MG/2ML IJ SOLN
INTRAMUSCULAR | Status: DC | PRN
  Administered 2020-02-27: 4 mg via INTRAVENOUS

## 2020-02-27 MED ORDER — ROCURONIUM BROMIDE 10 MG/ML (PF) SYRINGE
PREFILLED_SYRINGE | INTRAVENOUS | Status: DC | PRN
  Administered 2020-02-27: 80 mg via INTRAVENOUS
  Administered 2020-02-27: 20 mg via INTRAVENOUS

## 2020-02-27 MED ORDER — DEXAMETHASONE SODIUM PHOSPHATE 10 MG/ML IJ SOLN
INTRAMUSCULAR | Status: DC | PRN
  Administered 2020-02-27: 8 mg via INTRAVENOUS

## 2020-02-27 MED ORDER — FENTANYL CITRATE (PF) 100 MCG/2ML IJ SOLN
INTRAMUSCULAR | Status: AC
  Filled 2020-02-27: qty 2

## 2020-02-27 MED ORDER — BUPIVACAINE-EPINEPHRINE (PF) 0.25% -1:200000 IJ SOLN
INTRAMUSCULAR | Status: AC
  Filled 2020-02-27: qty 30

## 2020-02-27 MED ORDER — MIDAZOLAM HCL 2 MG/2ML IJ SOLN
INTRAMUSCULAR | Status: AC
  Filled 2020-02-27: qty 2

## 2020-02-27 MED ORDER — LACTATED RINGERS IV SOLN: INTRAVENOUS | Status: DC

## 2020-02-27 MED ORDER — ONDANSETRON 4 MG PO TBDP: 4.0000 mg | ORAL_TABLET | Freq: Four times a day (QID) | ORAL | Status: DC | PRN

## 2020-02-27 MED ORDER — ACETAMINOPHEN 650 MG RE SUPP: 650.0000 mg | Freq: Four times a day (QID) | RECTAL | Status: DC | PRN

## 2020-02-27 MED ORDER — SUGAMMADEX SODIUM 200 MG/2ML IV SOLN
INTRAVENOUS | Status: DC | PRN
  Administered 2020-02-27: 200 mg via INTRAVENOUS

## 2020-02-27 MED ORDER — FENTANYL CITRATE (PF) 250 MCG/5ML IJ SOLN
INTRAMUSCULAR | Status: AC
  Filled 2020-02-27: qty 5

## 2020-02-27 MED ORDER — LACTATED RINGERS IR SOLN
Status: DC | PRN
  Administered 2020-02-27: 1000 mL

## 2020-02-27 MED ORDER — FENTANYL CITRATE (PF) 100 MCG/2ML IJ SOLN
25.0000 ug | INTRAMUSCULAR | Status: DC | PRN
  Administered 2020-02-27 (×2): 50 ug via INTRAVENOUS

## 2020-02-27 MED ORDER — ACETAMINOPHEN 325 MG PO TABS
650.0000 mg | ORAL_TABLET | Freq: Four times a day (QID) | ORAL | Status: DC | PRN
  Administered 2020-02-27 – 2020-02-29 (×4): 650 mg via ORAL
  Filled 2020-02-27 (×4): qty 2

## 2020-02-27 MED ORDER — OXYCODONE HCL 5 MG PO TABS
5.0000 mg | ORAL_TABLET | ORAL | Status: DC | PRN
  Administered 2020-02-27: 10 mg via ORAL
  Administered 2020-02-27: 5 mg via ORAL
  Administered 2020-02-27 – 2020-02-29 (×7): 10 mg via ORAL
  Filled 2020-02-27 (×2): qty 2
  Filled 2020-02-27: qty 1
  Filled 2020-02-27 (×6): qty 2

## 2020-02-27 MED ORDER — ONDANSETRON HCL 4 MG/2ML IJ SOLN: 4.0000 mg | Freq: Four times a day (QID) | INTRAMUSCULAR | Status: DC | PRN

## 2020-02-27 MED ORDER — DEXAMETHASONE SODIUM PHOSPHATE 10 MG/ML IJ SOLN
INTRAMUSCULAR | Status: AC
  Filled 2020-02-27: qty 1

## 2020-02-27 MED ORDER — TRAMADOL HCL 50 MG PO TABS
50.0000 mg | ORAL_TABLET | Freq: Four times a day (QID) | ORAL | Status: DC | PRN
  Administered 2020-02-27 (×2): 50 mg via ORAL
  Filled 2020-02-27 (×2): qty 1

## 2020-02-27 MED ORDER — PHENYLEPHRINE 40 MCG/ML (10ML) SYRINGE FOR IV PUSH (FOR BLOOD PRESSURE SUPPORT)
PREFILLED_SYRINGE | INTRAVENOUS | Status: AC
  Filled 2020-02-27: qty 10

## 2020-02-27 MED ORDER — CELECOXIB 200 MG PO CAPS
200.0000 mg | ORAL_CAPSULE | Freq: Once | ORAL | Status: AC
  Administered 2020-02-27: 200 mg via ORAL
  Filled 2020-02-27: qty 1

## 2020-02-27 MED ORDER — LIDOCAINE 2% (20 MG/ML) 5 ML SYRINGE
INTRAMUSCULAR | Status: DC | PRN
  Administered 2020-02-27: 100 mg via INTRAVENOUS

## 2020-02-27 MED ORDER — LIDOCAINE 2% (20 MG/ML) 5 ML SYRINGE
INTRAMUSCULAR | Status: AC
  Filled 2020-02-27: qty 5

## 2020-02-27 MED ORDER — ACETAMINOPHEN 500 MG PO TABS
1000.0000 mg | ORAL_TABLET | Freq: Once | ORAL | Status: AC
  Administered 2020-02-27: 1000 mg via ORAL
  Filled 2020-02-27: qty 2

## 2020-02-27 MED ORDER — ONDANSETRON HCL 4 MG/2ML IJ SOLN
INTRAMUSCULAR | Status: AC
  Filled 2020-02-27: qty 2

## 2020-02-27 MED ORDER — CHLORHEXIDINE GLUCONATE 0.12 % MT SOLN
15.0000 mL | OROMUCOSAL | Status: AC
  Administered 2020-02-27: 15 mL via OROMUCOSAL

## 2020-02-27 MED ORDER — GADOBUTROL 1 MMOL/ML IV SOLN
7.0000 mL | Freq: Once | INTRAVENOUS | Status: AC | PRN
  Administered 2020-02-27: 7 mL via INTRAVENOUS

## 2020-02-27 MED ORDER — SODIUM CHLORIDE 0.9 % IV SOLN
2.0000 g | INTRAVENOUS | Status: DC
  Administered 2020-02-27 – 2020-02-28 (×2): 2 g via INTRAVENOUS
  Filled 2020-02-27 (×2): qty 2
  Filled 2020-02-27: qty 20

## 2020-02-27 MED ORDER — 0.9 % SODIUM CHLORIDE (POUR BTL) OPTIME
TOPICAL | Status: DC | PRN
  Administered 2020-02-27: 1000 mL

## 2020-02-27 MED ORDER — PROPOFOL 10 MG/ML IV BOLUS
INTRAVENOUS | Status: AC
  Filled 2020-02-27: qty 20

## 2020-02-27 MED ORDER — MIDAZOLAM HCL 5 MG/5ML IJ SOLN
INTRAMUSCULAR | Status: DC | PRN
  Administered 2020-02-27: 2 mg via INTRAVENOUS

## 2020-02-27 MED ORDER — FENTANYL CITRATE (PF) 100 MCG/2ML IJ SOLN
INTRAMUSCULAR | Status: DC | PRN
Start: 2020-02-27 — End: 2020-02-27
  Administered 2020-02-27 (×7): 50 ug via INTRAVENOUS

## 2020-02-27 SURGICAL SUPPLY — 38 items
APPLIER CLIP ROT 10 11.4 M/L (STAPLE) ×3
CABLE HIGH FREQUENCY MONO STRZ (ELECTRODE) ×3 IMPLANT
CHLORAPREP W/TINT 26 (MISCELLANEOUS) ×3 IMPLANT
CLIP APPLIE ROT 10 11.4 M/L (STAPLE) ×1 IMPLANT
CLOSURE WOUND 1/2 X4 (GAUZE/BANDAGES/DRESSINGS)
COVER MAYO STAND STRL (DRAPES) ×3 IMPLANT
COVER SURGICAL LIGHT HANDLE (MISCELLANEOUS) ×3 IMPLANT
COVER WAND RF STERILE (DRAPES) IMPLANT
DERMABOND ADVANCED (GAUZE/BANDAGES/DRESSINGS)
DERMABOND ADVANCED .7 DNX12 (GAUZE/BANDAGES/DRESSINGS) IMPLANT
DRAIN CHANNEL 19F RND (DRAIN) ×3 IMPLANT
DRAPE C-ARM 42X120 X-RAY (DRAPES) ×3 IMPLANT
ELECT REM PT RETURN 15FT ADLT (MISCELLANEOUS) ×3 IMPLANT
EVACUATOR SILICONE 100CC (DRAIN) ×3 IMPLANT
GAUZE SPONGE 2X2 8PLY STRL LF (GAUZE/BANDAGES/DRESSINGS) IMPLANT
GLOVE SURG ORTHO 8.0 STRL STRW (GLOVE) ×3 IMPLANT
GOWN STRL REUS W/TWL XL LVL3 (GOWN DISPOSABLE) ×6 IMPLANT
HEMOSTAT SURGICEL 4X8 (HEMOSTASIS) IMPLANT
KIT BASIN OR (CUSTOM PROCEDURE TRAY) ×3 IMPLANT
KIT TURNOVER KIT A (KITS) ×3 IMPLANT
PENCIL SMOKE EVACUATOR (MISCELLANEOUS) IMPLANT
POUCH SPECIMEN RETRIEVAL 10MM (ENDOMECHANICALS) ×3 IMPLANT
SCISSORS LAP 5X35 DISP (ENDOMECHANICALS) ×3 IMPLANT
SET CHOLANGIOGRAPH MIX (MISCELLANEOUS) ×3 IMPLANT
SET IRRIG TUBING LAPAROSCOPIC (IRRIGATION / IRRIGATOR) ×3 IMPLANT
SET TUBE SMOKE EVAC HIGH FLOW (TUBING) ×3 IMPLANT
SLEEVE XCEL OPT CAN 5 100 (ENDOMECHANICALS) ×3 IMPLANT
SPONGE GAUZE 2X2 STER 10/PKG (GAUZE/BANDAGES/DRESSINGS)
STRIP CLOSURE SKIN 1/2X4 (GAUZE/BANDAGES/DRESSINGS) IMPLANT
SUT ETHILON 2 0 PS N (SUTURE) ×3 IMPLANT
SUT MNCRL AB 4-0 PS2 18 (SUTURE) ×6 IMPLANT
SUT VICRYL 0 UR6 27IN ABS (SUTURE) ×3 IMPLANT
TOWEL OR 17X26 10 PK STRL BLUE (TOWEL DISPOSABLE) ×3 IMPLANT
TOWEL OR NON WOVEN STRL DISP B (DISPOSABLE) ×3 IMPLANT
TRAY LAPAROSCOPIC (CUSTOM PROCEDURE TRAY) ×3 IMPLANT
TROCAR BLADELESS OPT 5 100 (ENDOMECHANICALS) ×3 IMPLANT
TROCAR XCEL BLUNT TIP 100MML (ENDOMECHANICALS) ×3 IMPLANT
TROCAR XCEL NON-BLD 11X100MML (ENDOMECHANICALS) ×3 IMPLANT

## 2020-02-27 NOTE — Transfer of Care (Signed)
Immediate Anesthesia Transfer of Care Note  Patient: Rourke Mcquitty  Procedure(s) Performed: LAPAROSCOPIC CHOLECYSTECTOMY (N/A Abdomen)  Patient Location: PACU  Anesthesia Type:General  Level of Consciousness: drowsy and patient cooperative  Airway & Oxygen Therapy: Patient Spontanous Breathing and Patient connected to face mask oxygen  Post-op Assessment: Report given to RN and Post -op Vital signs reviewed and stable  Post vital signs: Reviewed and stable  Last Vitals:  Vitals Value Taken Time  BP 154/80 02/27/20 1107  Temp 37.3 C 02/27/20 1107  Pulse 62 02/27/20 1109  Resp 16 02/27/20 1109  SpO2 100 % 02/27/20 1109  Vitals shown include unvalidated device data.  Last Pain:  Vitals:   02/27/20 0801  TempSrc: Oral  PainSc: 0-No pain      Patients Stated Pain Goal: 4 (02/26/20 2020)  Complications: No complications documented.

## 2020-02-27 NOTE — Interval H&P Note (Signed)
History and Physical Interval Note:  02/27/2020 8:37 AM  Tim Russo  has presented today for surgery, with the diagnosis of CHOLECYSTITIS.  The various methods of treatment have been discussed with the patient and family. After consideration of risks, benefits and other options for treatment, the patient has consented to    Procedure(s): LAPAROSCOPIC CHOLECYSTECTOMY WITH INTRAOPERATIVE CHOLANGIOGRAM (N/A) as a surgical intervention.    The patient's history has been reviewed, patient examined, no change in status, stable for surgery.  I have reviewed the patient's chart and labs.  Questions were answered to the patient's satisfaction.    Darnell Level, MD [facility] Office: [phone]   Darnell Level

## 2020-02-27 NOTE — Anesthesia Procedure Notes (Signed)
Procedure Name: Intubation Date/Time: 02/27/2020 9:09 AM Performed by: Montel Clock, CRNA Pre-anesthesia Checklist: Patient identified, Emergency Drugs available, Suction available, Patient being monitored and Timeout performed Patient Re-evaluated:Patient Re-evaluated prior to induction Oxygen Delivery Method: Circle system utilized Preoxygenation: Pre-oxygenation with 100% oxygen Induction Type: IV induction Ventilation: Mask ventilation without difficulty Laryngoscope Size: Mac and 3 Grade View: Grade I Tube type: Oral Tube size: 7.5 mm Number of attempts: 1 Airway Equipment and Method: Stylet Placement Confirmation: ETT inserted through vocal cords under direct vision,  positive ETCO2 and breath sounds checked- equal and bilateral Secured at: 23 cm Tube secured with: Tape Dental Injury: Teeth and Oropharynx as per pre-operative assessment

## 2020-02-27 NOTE — Consult Note (Signed)
Reason for Consult: ? Choledocholithiasis and abdominal liver enzymes. Referring Physician: CCS  Herbie Drape HPI: This is a 54 year old male with a PMH of GERD, hiatal hernia s/p Nissen fundoplication in the 1990's, and HTN admitted with complaints of post prandial abdominal pain.  The symptoms started on 9/5 and with eating blander meals his symptoms improved, however, on 9.01/2020 eating a normal dinner exacerbated the pain. It was also associated with nausea, night sweats, and a reported fever of 101 F.  In the ER the ultrasound showed a thicked gallbladder wall at 8 mm and a positive Murphy's sign.  His preoperative labs this AM showed an increase in his liver enzymes:  AST 12->169, ALT 18->152, AP 70->192, and TB 1.6->5.4.  Dr. Gerrit Friends performed a lap chole for acute cholecystitis.  Per Dr. Eligha Bridegroom there was a significant amount of inflammation and his gallbladder was full of pigmented stones.  With the inflammation, an IOC was not possible and a drain was placed.  There was concern for choledocholithiasis and an MRCP was ordered.  The MRCP was negative for any evidence of choledocholithiasis, but there was the possibility of a bile leak.  It is not clear if a leak exists in this post operative state.  History reviewed. No pertinent past medical history.  History reviewed. No pertinent surgical history.  History reviewed. No pertinent family history.  Social History:  reports that he has never smoked. He has never used smokeless tobacco. No history on file for alcohol use and drug use.  Allergies:  Allergies  Allergen Reactions  . Penicillins Rash    Medications:  Scheduled: . fentaNYL       Continuous: . sodium chloride 75 mL/hr at 02/27/20 1446  . cefTRIAXone (ROCEPHIN)  IV 2 g (02/27/20 1256)    Results for orders placed or performed during the hospital encounter of 02/26/20 (from the past 24 hour(s))  Lactic acid, plasma     Status: None   Collection Time: 02/26/20  4:22 PM   Result Value Ref Range   Lactic Acid, Venous 1.0 0.5 - 1.9 mmol/L  HIV Antibody (routine testing w rflx)     Status: None   Collection Time: 02/26/20  4:22 PM  Result Value Ref Range   HIV Screen 4th Generation wRfx Non Reactive Non Reactive  Surgical PCR screen     Status: None   Collection Time: 02/26/20  8:28 PM   Specimen: Nasal Mucosa; Nasal Swab  Result Value Ref Range   MRSA, PCR NEGATIVE NEGATIVE   Staphylococcus aureus NEGATIVE NEGATIVE  Comprehensive metabolic panel     Status: Abnormal   Collection Time: 02/27/20  3:16 AM  Result Value Ref Range   Sodium 134 (L) 135 - 145 mmol/L   Potassium 4.0 3.5 - 5.1 mmol/L   Chloride 103 98 - 111 mmol/L   CO2 24 22 - 32 mmol/L   Glucose, Bld 117 (H) 70 - 99 mg/dL   BUN 12 6 - 20 mg/dL   Creatinine, Ser 0.76 0.61 - 1.24 mg/dL   Calcium 8.3 (L) 8.9 - 10.3 mg/dL   Total Protein 6.0 (L) 6.5 - 8.1 g/dL   Albumin 2.8 (L) 3.5 - 5.0 g/dL   AST 226 (H) 15 - 41 U/L   ALT 152 (H) 0 - 44 U/L   Alkaline Phosphatase 192 (H) 38 - 126 U/L   Total Bilirubin 5.4 (H) 0.3 - 1.2 mg/dL   GFR calc non Af Amer >60 >60 mL/min  GFR calc Af Amer >60 >60 mL/min   Anion gap 7 5 - 15  CBC     Status: None   Collection Time: 02/27/20  3:16 AM  Result Value Ref Range   WBC 8.3 4.0 - 10.5 K/uL   RBC 4.26 4.22 - 5.81 MIL/uL   Hemoglobin 13.9 13.0 - 17.0 g/dL   HCT 42.5 39 - 52 %   MCV 96.9 80.0 - 100.0 fL   MCH 32.6 26.0 - 34.0 pg   MCHC 33.7 30.0 - 36.0 g/dL   RDW 95.6 38.7 - 56.4 %   Platelets 215 150 - 400 K/uL   nRBC 0.0 0.0 - 0.2 %     MR 3D Recon At Scanner  Addendum Date: 02/27/2020   ADDENDUM REPORT: 02/27/2020 15:16 ADDENDUM: For clarification, there is no common duct stone seen on the current study. The possibility of biliary leak is considered given the clinical scenario and the lack of visible alternative explanation on the current exam given the presence of fluid about the gallbladder fossa, RIGHT subhepatic and perihepatic regions.  Scattered pneumoperitoneum in the immediate postoperative setting. These results were called by telephone at the time of interpretation on 02/27/2020 at 3:14 pm to provider [facility] , who verbally acknowledged these results. Electronically Signed   By: Donzetta Kohut M.D.   On: 02/27/2020 15:16   Result Date: 02/27/2020 CLINICAL DATA:  Cholelithiasis with elevated bilirubin suspect choledocholithiasis EXAM: MRI ABDOMEN WITHOUT AND WITH CONTRAST (INCLUDING MRCP) TECHNIQUE: Multiplanar multisequence MR imaging of the abdomen was performed both before and after the administration of intravenous contrast. Heavily T2-weighted images of the biliary and pancreatic ducts were obtained, and three-dimensional MRCP images were rendered by post processing. CONTRAST:  90mL GADAVIST GADOBUTROL 1 MMOL/ML IV SOLN COMPARISON:  Ultrasound evaluation performed prior to cholecystectomy. FINDINGS: Lower chest: Basilar airspace disease bilaterally at the lung bases in a dependent distribution. No effusion. Large hiatal hernia extends into the chest approximately 50% of the stomach herniates into the chest. Hepatobiliary: Post cholecystectomy. Surgical drain enters via RIGHT flank approach and terminates beneath the RIGHT hepatic margin tracking towards the LEFT hepatic margin through the gallbladder fossa. Small amount of fluid in the gallbladder fossa without organized features. Trace perihepatic fluid in gas measuring approximately 11 mm thickness. This is with respect to the fluid. Trace amounts of pneumoperitoneum adjacent to this area tracking up over the liver and along the anterior abdomen in the peritoneal cavity following laparoscopic procedure. Common bile duct is nondilated tiny periampullary duodenal diverticulum. No filling defects suggested on MRCP sequences with mild attenuation of the distal common bile duct which again shows no sign of dilation small amounts of fluid beneath the RIGHT hemi liver as well Pancreas:  Small cystic lesion in the pancreatic body 4 mm no main pancreatic ductal dilation. Mild edema about the pancreatic head, none about the body of the pancreas with normal intrinsic T1 signal in the pancreas. Spleen:  Normal in size and contour without focal lesion. Adrenals/Urinary Tract: Adrenal glands are normal. Symmetric renal enhancement. Stomach/Bowel: Gaseous distension of the colon. No pericolonic stranding. No acute gastrointestinal process to the extent evaluated. Pelvic bowel loops not imaged. Vascular/Lymphatic: Vascular structures in the abdomen are patent with replaced hepatic arterial supply arising from SMA to the RIGHT hepatic lobe. Portal vein is patent. No aneurysmal dilation of the abdominal aorta. There is no gastrohepatic or hepatoduodenal ligament lymphadenopathy. No retroperitoneal or mesenteric lymphadenopathy. Other:  Fluid about the liver as discussed. Musculoskeletal:  No suspicious bone lesions identified. IMPRESSION: 1. Post cholecystectomy. Trace amounts of pneumoperitoneum and fluid about the liver. No organized features. Given distribution of fluid while nonspecific in the postoperative state the possibility of bile leak is considered given lack of biliary duct pathology. Correlate with surgical drain output. 2. No sign of choledocholithiasis. Normal caliber biliary tree note that there is no dilation of the common bile duct. 3. Mild edema about the pancreatic head, may be postoperative, correlate with lipase to exclude the possibility of concomitant pancreatitis. 4. 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 5. Large hiatal hernia extends into the chest approximately 50% of the stomach herniates into the chest. 6. Basilar airspace disease bilaterally at the lung bases in a dependent distribution likely atelectasis. Difficult to exclude developing infection on MRI. Attention on follow-up. 7. Small cystic lesion in the pancreatic body, 4 mm. Recommend follow-up MRI at one year  to ensure stability. This recommendation follows ACR consensus guidelines: Management of Incidental Pancreatic Cysts: A White Paper of the ACR Incidental Findings Committee. J Am Coll Radiol -7. A call is out to the referring provider to further discuss findings in the above case Electronically Signed: By: Donzetta KohutGeoffrey  Wile M.D. On: 02/27/2020 15:02   DG Chest Port 1 View  Result Date: 02/26/2020 CLINICAL DATA:  Abdominal pain after eating, since then having abdominal cramping, fatigue, loss of appetite, sweats and chills. Fever up to 101. Question sepsis. EXAM: PORTABLE CHEST 1 VIEW COMPARISON:  None. FINDINGS: The heart size and mediastinal contours are within normal limits. No focal consolidation. No pulmonary edema. No pleural effusion. No pneumothorax. No acute osseous abnormality. IMPRESSION: No active cardiopulmonary disease. Electronically Signed   By: Tish FredericksonMorgane  Naveau M.D.   On: 02/26/2020 12:48   MR ABDOMEN MRCP W WO CONTAST  Addendum Date: 02/27/2020   ADDENDUM REPORT: 02/27/2020 15:16 ADDENDUM: For clarification, there is no common duct stone seen on the current study. The possibility of biliary leak is considered given the clinical scenario and the lack of visible alternative explanation on the current exam given the presence of fluid about the gallbladder fossa, RIGHT subhepatic and perihepatic regions. Scattered pneumoperitoneum in the immediate postoperative setting. These results were called by telephone at the time of interpretation on 02/27/2020 at 3:14 pm to [facility] , who verbally acknowledged these results. Electronically Signed   By: Donzetta KohutGeoffrey  Wile M.D.   On: 02/27/2020 15:16   Result Date: 02/27/2020 CLINICAL DATA:  Cholelithiasis with elevated bilirubin suspect choledocholithiasis EXAM: MRI ABDOMEN WITHOUT AND WITH CONTRAST (INCLUDING MRCP) TECHNIQUE: Multiplanar multisequence MR imaging of the abdomen was performed both before and after the administration of intravenous  contrast. Heavily T2-weighted images of the biliary and pancreatic ducts were obtained, and three-dimensional MRCP images were rendered by post processing. CONTRAST:  7mL GADAVIST GADOBUTROL 1 MMOL/ML IV SOLN COMPARISON:  Ultrasound evaluation performed prior to cholecystectomy. FINDINGS: Lower chest: Basilar airspace disease bilaterally at the lung bases in a dependent distribution. No effusion. Large hiatal hernia extends into the chest approximately 50% of the stomach herniates into the chest. Hepatobiliary: Post cholecystectomy. Surgical drain enters via RIGHT flank approach and terminates beneath the RIGHT hepatic margin tracking towards the LEFT hepatic margin through the gallbladder fossa. Small amount of fluid in the gallbladder fossa without organized features. Trace perihepatic fluid in gas measuring approximately 11 mm thickness. This is with respect to the fluid. Trace amounts of pneumoperitoneum adjacent to this area tracking up over the liver and along the anterior  abdomen in the peritoneal cavity following laparoscopic procedure. Common bile duct is nondilated tiny periampullary duodenal diverticulum. No filling defects suggested on MRCP sequences with mild attenuation of the distal common bile duct which again shows no sign of dilation small amounts of fluid beneath the RIGHT hemi liver as well Pancreas: Small cystic lesion in the pancreatic body 4 mm no main pancreatic ductal dilation. Mild edema about the pancreatic head, none about the body of the pancreas with normal intrinsic T1 signal in the pancreas. Spleen:  Normal in size and contour without focal lesion. Adrenals/Urinary Tract: Adrenal glands are normal. Symmetric renal enhancement. Stomach/Bowel: Gaseous distension of the colon. No pericolonic stranding. No acute gastrointestinal process to the extent evaluated. Pelvic bowel loops not imaged. Vascular/Lymphatic: Vascular structures in the abdomen are patent with replaced hepatic arterial  supply arising from SMA to the RIGHT hepatic lobe. Portal vein is patent. No aneurysmal dilation of the abdominal aorta. There is no gastrohepatic or hepatoduodenal ligament lymphadenopathy. No retroperitoneal or mesenteric lymphadenopathy. Other:  Fluid about the liver as discussed. Musculoskeletal: No suspicious bone lesions identified. IMPRESSION: 1. Post cholecystectomy. Trace amounts of pneumoperitoneum and fluid about the liver. No organized features. Given distribution of fluid while nonspecific in the postoperative state the possibility of bile leak is considered given lack of biliary duct pathology. Correlate with surgical drain output. 2. No sign of choledocholithiasis. Normal caliber biliary tree note that there is no dilation of the common bile duct. 3. Mild edema about the pancreatic head, may be postoperative, correlate with lipase to exclude the possibility of concomitant pancreatitis. 4. 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 5. Large hiatal hernia extends into the chest approximately 50% of the stomach herniates into the chest. 6. Basilar airspace disease bilaterally at the lung bases in a dependent distribution likely atelectasis. Difficult to exclude developing infection on MRI. Attention on follow-up. 7. Small cystic lesion in the pancreatic body, 4 mm. Recommend follow-up MRI at one year to ensure stability. This recommendation follows ACR consensus guidelines: Management of Incidental Pancreatic Cysts: A White Paper of the ACR Incidental Findings Committee. J Am Coll Radiol -7. A call is out to the referring provider to further discuss findings in the above case Electronically Signed: By: Donzetta Kohut M.D. On: 02/27/2020 15:02   US Abdomen Limited RUQ  Result Date: 02/26/2020 CLINICAL DATA:  Right upper quadrant pain. EXAM: ULTRASOUND ABDOMEN LIMITED RIGHT UPPER QUADRANT COMPARISON:  No prior. FINDINGS: Gallbladder: Sludge is noted the gallbladder. Gallbladder wall is  significantly thickened at 8 mm. Positive Murphy sign. Cholecystitis could present in this fashion. Common bile duct: Diameter: 4.3 mm Liver: Increased echogenicity consistent fatty infiltration or hepatocellular disease. Portal vein is patent on color Doppler imaging with normal direction of blood flow towards the liver. Other: None. IMPRESSION: 1. Gallbladder sludge is noted. Gallbladder wall is significantly thickened at 8 mm. Positive Murphy sign. Cholecystitis could present this fashion. 2. Increased hepatic echogenicity consistent fatty infiltration or hepatocellular disease. Electronically Signed   By: Maisie Fus  Register   On: 02/26/2020 13:40    ROS:  As stated above in the HPI otherwise negative.  Blood pressure (!) 144/96, pulse 62, temperature 97.8 F (36.6 C), resp. rate 18, height 5\' 8"  (1.727 m), weight 77.8 kg, SpO2 99 %.    PE: Gen: NAD, Alert and Oriented Lungs: CTA Bilaterally CV: RRR without M/G/R ABD: Soft, NTND, tender at the incision sites Ext: No C/C/E  Assessment/Plan: 1) S/p lap chole for acute cholecystitis.  2) ? Bile leak. 3) Abnormal liver enzymes.   The patient's elevated liver enzymes are secondary to his acute cholecystitis as there is no evidence of any CBD stones.  There is the possibility of a bile leak, but it is difficult to determine in this immediate post operative state.  Plan: 1) Follow drain output. 2) If there is a significant amount of biliary drainage, a HIDA scan will need to be performed.  Pending the results an ERCP may be required.  Alston Berrie D 02/27/2020, 3:30 PM

## 2020-02-27 NOTE — Op Note (Signed)
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  White blood cell count was elevated.  Ultrasound demonstrated a thick-walled gallbladder with inflammatory changes and sludge consistent with acute cholecystitis.  Patient was admitted to the general surgery service and prepared for the operating room.  Procedure:  The patient was seen in the pre-op holding area. The risks, benefits, complications, treatment options, and expected outcomes were previously discussed with the patient. The patient agreed with the proposed plan and has signed the informed consent form.  The patient was brought to the operating room by the surgical team, identified as Herbie Drape and the procedure verified. A "time out" was completed and the above information confirmed.  Following induction of general anesthesia, the patient was positioned and then prepped and draped in the usual aseptic fashion.  After ascertaining that an adequate level of anesthesia been achieved, an infraumbilical incision is made with a #15 blade.  Dissection is carried down to the fascia.  Fascia is incised in the midline and the peritoneal cavity is entered cautiously.  0 Vicryl pursestring sutures placed in the fascia.  An Hassan cannula was introduced under direct vision and secured with the pursestring suture.  Abdomen is insufflated with carbon dioxide.  The abdomen was evaluated.  There is acute inflammation in the right upper quadrant with omental adhesions to the gallbladder and  undersurface of the liver.  Operative ports were placed in the subxiphoid position, midclavicular line, and anterior axillary line subcostally.  Liver is elevated.  Omental adhesions are taken down with blunt dissection exposing a thick-walled inflamed gallbladder which is distended.  Using the aspirating trocar, the gallbladder is evacuated.  It is then grasped and retracted cephalad.  Omentum is taken down off of the surface of the gallbladder allowing for exposure down to the neck of the gallbladder.  There is dense inflammatory changes and edema around the neck of the gallbladder and porta hepatis.  With careful dissection the lymph node is mobilized at the neck of the gallbladder.  Structures are gently dissected out and divided between ligaclips.  Technically it was not possible to perform cholangiography.  Gallbladder is then mobilized off of the gallbladder bed using the hook electrocautery for hemostasis.  Posterior wall of the gallbladder is densely adherent to the surface of the liver and was difficult to dissected away from the liver without opening the gallbladder.  Numerous pigmented gallstones were identified and retrieved.  The entire gallbladder was excised off of the liver and placed into an Endo Catch bag.  This was withdrawn through the umbilical port.  Right upper quadrant was copiously irrigated with normal saline.  Stones were retrieved.  Good hemostasis was noted in the gallbladder bed with the electrocautery used judiciously.  A 19 [place] Blake drain is brought into the right upper quadrant and retrieved through the lateral port site.  It is placed in the subhepatic space.  It is secured to the skin with a nylon suture.  Good hemostasis is noted at all port sites.  Ports are removed under  direct vision.  Pneumoperitoneum is released.  Additional sutures are placed at the umbilical fascial incision with interrupted 0 Vicryl.  Drain is placed to bulb suction.  Skin incisions are  anesthetized with local anesthetic and then closed with 4-0 Monocryl sutures.  Wounds are washed and dried and Dermabond is applied as dressing.  Patient is awakened from anesthesia and brought to the recovery room in stable condition.  The patient tolerated the procedure well.   Darnell Level, MD [facility] Office: [phone]

## 2020-02-27 NOTE — Anesthesia Postprocedure Evaluation (Signed)
Anesthesia Post Note  Patient: Tim Russo  Procedure(s) Performed: LAPAROSCOPIC CHOLECYSTECTOMY (N/A Abdomen)     Patient location during evaluation: PACU Anesthesia Type: General Level of consciousness: sedated Pain management: pain level controlled Vital Signs Assessment: post-procedure vital signs reviewed and stable Respiratory status: spontaneous breathing and respiratory function stable Cardiovascular status: stable Postop Assessment: no apparent nausea or vomiting Anesthetic complications: no   No complications documented.  Last Vitals:  Vitals:   02/27/20 1215 02/27/20 1225  BP: (!) 141/91 (!) 167/96  Pulse: 63 (!) 59  Resp: 13 17  Temp: 36.8 C 36.9 C  SpO2: 99% 99%    Last Pain:  Vitals:   02/27/20 1225  TempSrc: Oral  PainSc:                  Bricia Taher DANIEL

## 2020-02-28 ENCOUNTER — Encounter (HOSPITAL_COMMUNITY): Payer: Self-pay | Admitting: Surgery

## 2020-02-28 LAB — CBC
HCT: 42.7 % (ref 39.0–52.0)
Hemoglobin: 14.6 g/dL (ref 13.0–17.0)
MCH: 33.2 pg (ref 26.0–34.0)
MCHC: 34.2 g/dL (ref 30.0–36.0)
MCV: 97 fL (ref 80.0–100.0)
Platelets: 265 10*3/uL (ref 150–400)
RBC: 4.4 MIL/uL (ref 4.22–5.81)
RDW: 12.4 % (ref 11.5–15.5)
WBC: 13.4 10*3/uL — ABNORMAL HIGH (ref 4.0–10.5)
nRBC: 0 % (ref 0.0–0.2)

## 2020-02-28 LAB — COMPREHENSIVE METABOLIC PANEL
ALT: 153 U/L — ABNORMAL HIGH (ref 0–44)
AST: 80 U/L — ABNORMAL HIGH (ref 15–41)
Albumin: 3.2 g/dL — ABNORMAL LOW (ref 3.5–5.0)
Alkaline Phosphatase: 164 U/L — ABNORMAL HIGH (ref 38–126)
Anion gap: 12 (ref 5–15)
BUN: 12 mg/dL (ref 6–20)
CO2: 27 mmol/L (ref 22–32)
Calcium: 9.2 mg/dL (ref 8.9–10.3)
Chloride: 98 mmol/L (ref 98–111)
Creatinine, Ser: 0.75 mg/dL (ref 0.61–1.24)
GFR calc Af Amer: >60 mL/min (ref >60)
GFR calc non Af Amer: >60 mL/min (ref >60)
Glucose, Bld: 111 mg/dL — ABNORMAL HIGH (ref 70–99)
Potassium: 4.7 mmol/L (ref 3.5–5.1)
Sodium: 137 mmol/L (ref 135–145)
Total Bilirubin: 3.1 mg/dL — ABNORMAL HIGH (ref 0.3–1.2)
Total Protein: 6.6 g/dL (ref 6.5–8.1)

## 2020-02-28 MED ORDER — HEPARIN SODIUM (PORCINE) 5000 UNIT/ML IJ SOLN
5000.0000 [IU] | Freq: Three times a day (TID) | INTRAMUSCULAR | Status: DC
  Administered 2020-02-28 – 2020-02-29 (×4): 5000 [IU] via SUBCUTANEOUS
  Filled 2020-02-28 (×4): qty 1

## 2020-02-28 NOTE — Progress Notes (Signed)
Subjective No acute events. Soreness around incisions. Some nausea but not emesis. Pain controlled well - last dose of IV pain medication was yesterday afternoon.  Objective: Vital signs in last 24 hours: Temp:  [97.6 F (36.4 C)-99.2 F (37.3 C)] 98.2 F (36.8 C) (09/11 0531) Pulse Rate:  [59-80] 63 (09/11 0531) Resp:  [12-19] 16 (09/11 0531) BP: (115-167)/(80-96) 115/80 (09/11 0531) SpO2:  [96 %-100 %] 96 % (09/11 0531) Last BM Date: 02/26/20  Intake/Output from previous day: 09/10 0701 - 09/11 0700 In: 3362.5 [P.O.:1080; I.V.:2182.5; IV Piggyback:100] Out: 1580 [Urine:1500; Drains:30; Blood:50] Intake/Output this shift: No intake/output data recorded.  Gen: NAD, comfortable CV: RRR Pulm: Normal work of breathing Abd: Soft, appropriately ttp around incisions, nondistended; drain in place serosanguinous with bilious tinge to fluid in bulb Ext: SCDs in place  Lab Results: CBC  Recent Labs    02/27/20 0316 02/28/20 0329  WBC 8.3 13.4*  HGB 13.9 14.6  HCT 41.3 42.7  PLT 215 265   BMET Recent Labs    02/27/20 0316 02/28/20 0329  NA 134* 137  K 4.0 4.7  CL 103 98  CO2 24 27  GLUCOSE 117* 111*  BUN 12 12  CREATININE 0.73 0.75  CALCIUM 8.3* 9.2   PT/INR Recent Labs    02/26/20 1245  LABPROT 13.6  INR 1.1   ABG No results for input(s): PHART, HCO3 in the last 72 hours.  Invalid input(s): PCO2, PO2  Studies/Results:  Anti-infectives: Anti-infectives (From admission, onward)   Start     Dose/Rate Route Frequency Ordered Stop   02/27/20 1400  cefTRIAXone (ROCEPHIN) 2 g in sodium chloride 0.9 % 100 mL IVPB  Status:  Discontinued        2 g 200 mL/hr over 30 Minutes Intravenous Every 24 hours 02/26/20 1514 02/27/20 1230   02/27/20 1400  cefTRIAXone (ROCEPHIN) 2 g in sodium chloride 0.9 % 100 mL IVPB        2 g 200 mL/hr over 30 Minutes Intravenous Every 24 hours 02/27/20 1230     02/26/20 1515  cefTRIAXone (ROCEPHIN) 2 g in sodium chloride 0.9 % 100 mL  IVPB  Status:  Discontinued        2 g 200 mL/hr over 30 Minutes Intravenous Every 24 hours 02/26/20 1513 02/26/20 1514   02/26/20 1400  cefTRIAXone (ROCEPHIN) 2 g in sodium chloride 0.9 % 100 mL IVPB        2 g 200 mL/hr over 30 Minutes Intravenous  Once 02/26/20 1346 02/26/20 1433       Assessment/Plan: Patient Active Problem List   Diagnosis Date Noted  . Acute calculous cholecystitis 02/27/2020  . Cholecystitis 02/26/2020   s/p Procedure(s): LAPAROSCOPIC CHOLECYSTECTOMY 02/27/2020  -MRCP showed some fluid in fossa -Bile tinge to serosang liquid in bulb - monitor as he has resolving hyperbilirubinemia which can cause bilious tinged fluid as well -Advance to soft diet as tolerated -Repeat labs tomorrow -PPx: SQH, SCDs   LOS: 1 day   Stephanie Coup. Cliffton Asters, M.D. [facility] Use [email or web address] to contact on call provider

## 2020-02-28 NOTE — Progress Notes (Signed)
Unassigned patient Subjective: Patient complains of worsening pain in the RUQ. He denies having any nausea or vomiting. MRCP showed fluid  in the fossa. T.B 3.1. AST/ALT 80/153.  Objective: Vital signs in last 24 hours: Temp:  [97.6 F (36.4 C)-99.2 F (37.3 C)] 98.2 F (36.8 C) (09/11 0531) Pulse Rate:  [59-80] 63 (09/11 0531) Resp:  [12-20] 16 (09/11 0531) BP: (115-167)/(80-96) 115/80 (09/11 0531) SpO2:  [96 %-100 %] 96 % (09/11 0531) Weight:  [77.8 kg] 77.8 kg (09/10 0801) Last BM Date: 02/26/20  Intake/Output from previous day: 09/10 0701 - 09/11 0700 In: 3362.5 [P.O.:1080; I.V.:2182.5; IV Piggyback:100] Out: 1580 [Urine:1500; Drains:30; Blood:50] Intake/Output this shift: No intake/output data recorded.  General appearance: alert, cooperative, fatigued, moderate distress and pale Resp: clear to auscultation bilaterally Cardio: regular rate and rhythm, S1, S2 normal, no murmur, click, rub or gallop GI: soft, tender on palpation ; bowel sounds normal; no masses,  no organomegaly; drain in place-bilious fluid in bulb Extremities: extremities normal, atraumatic, no cyanosis or edema  Lab Results: Recent Labs    02/26/20 1245 02/27/20 0316 02/28/20 0329  WBC 13.2* 8.3 13.4*  HGB 16.9 13.9 14.6  HCT 47.6 41.3 42.7  PLT 279 215 265   BMET Recent Labs    02/26/20 1245 02/27/20 0316 02/28/20 0329  NA 133* 134* 137  K 3.7 4.0 4.7  CL 98 103 98  CO2 24 24 27   GLUCOSE 101* 117* 111*  BUN 16 12 12   CREATININE 0.87 0.73 0.75  CALCIUM 9.0 8.3* 9.2   LFT Recent Labs    02/28/20 0329  PROT 6.6  ALBUMIN 3.2*  AST 80*  ALT 153*  ALKPHOS 164*  BILITOT 3.1*   PT/INR Recent Labs    02/26/20 1245  LABPROT 13.6  INR 1.1   Studies/Results: MR 3D Recon At Scanner  Addendum Date: 02/27/2020   ADDENDUM REPORT: 02/27/2020 15:16 ADDENDUM: For clarification, there is no common duct stone seen on the current study. The possibility of biliary leak is considered given  the clinical scenario and the lack of visible alternative explanation on the current exam given the presence of fluid about the gallbladder fossa, RIGHT subhepatic and perihepatic regions. Scattered pneumoperitoneum in the immediate postoperative setting. These results were called by telephone at the time of interpretation on 02/27/2020 at 3:14 pm to provider [facility] , who verbally acknowledged these results. Electronically Signed   By: 04/28/2020 M.D.   On: 02/27/2020 15:16   Result Date: 02/27/2020 CLINICAL DATA:  Cholelithiasis with elevated bilirubin suspect choledocholithiasis EXAM: MRI ABDOMEN WITHOUT AND WITH CONTRAST (INCLUDING MRCP) TECHNIQUE: Multiplanar multisequence MR imaging of the abdomen was performed both before and after the administration of intravenous contrast. Heavily T2-weighted images of the biliary and pancreatic ducts were obtained, and three-dimensional MRCP images were rendered by post processing. CONTRAST:  9mL GADAVIST GADOBUTROL 1 MMOL/ML IV SOLN COMPARISON:  Ultrasound evaluation performed prior to cholecystectomy. FINDINGS: Lower chest: Basilar airspace disease bilaterally at the lung bases in a dependent distribution. No effusion. Large hiatal hernia extends into the chest approximately 50% of the stomach herniates into the chest. Hepatobiliary: Post cholecystectomy. Surgical drain enters via RIGHT flank approach and terminates beneath the RIGHT hepatic margin tracking towards the LEFT hepatic margin through the gallbladder fossa. Small amount of fluid in the gallbladder fossa without organized features. Trace perihepatic fluid in gas measuring approximately 11 mm thickness. This is with respect to the fluid. Trace amounts of pneumoperitoneum adjacent to this area tracking  up over the liver and along the anterior abdomen in the peritoneal cavity following laparoscopic procedure. Common bile duct is nondilated tiny periampullary duodenal diverticulum. No filling defects  suggested on MRCP sequences with mild attenuation of the distal common bile duct which again shows no sign of dilation small amounts of fluid beneath the RIGHT hemi liver as well Pancreas: Small cystic lesion in the pancreatic body 4 mm no main pancreatic ductal dilation. Mild edema about the pancreatic head, none about the body of the pancreas with normal intrinsic T1 signal in the pancreas. Spleen:  Normal in size and contour without focal lesion. Adrenals/Urinary Tract: Adrenal glands are normal. Symmetric renal enhancement. Stomach/Bowel: Gaseous distension of the colon. No pericolonic stranding. No acute gastrointestinal process to the extent evaluated. Pelvic bowel loops not imaged. Vascular/Lymphatic: Vascular structures in the abdomen are patent with replaced hepatic arterial supply arising from SMA to the RIGHT hepatic lobe. Portal vein is patent. No aneurysmal dilation of the abdominal aorta. There is no gastrohepatic or hepatoduodenal ligament lymphadenopathy. No retroperitoneal or mesenteric lymphadenopathy. Other:  Fluid about the liver as discussed. Musculoskeletal: No suspicious bone lesions identified. IMPRESSION: 1. Post cholecystectomy. Trace amounts of pneumoperitoneum and fluid about the liver. No organized features. Given distribution of fluid while nonspecific in the postoperative state the possibility of bile leak is considered given lack of biliary duct pathology. Correlate with surgical drain output. 2. No sign of choledocholithiasis. Normal caliber biliary tree note that there is no dilation of the common bile duct. 3. Mild edema about the pancreatic head, may be postoperative, correlate with lipase to exclude the possibility of concomitant pancreatitis. 4. 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 5. Large hiatal hernia extends into the chest approximately 50% of the stomach herniates into the chest. 6. Basilar airspace disease bilaterally at the lung bases in a dependent  distribution likely atelectasis. Difficult to exclude developing infection on MRI. Attention on follow-up. 7. Small cystic lesion in the pancreatic body, 4 mm. Recommend follow-up MRI at one year to ensure stability. This recommendation follows ACR consensus guidelines: Management of Incidental Pancreatic Cysts: A White Paper of the ACR Incidental Findings Committee. J Am Coll Radiol -7. A call is out to the referring provider to further discuss findings in the above case Electronically Signed: By: Donzetta Kohut M.D. On: 02/27/2020 15:02   DG Chest Port 1 View  Result Date: 02/26/2020 CLINICAL DATA:  Abdominal pain after eating, since then having abdominal cramping, fatigue, loss of appetite, sweats and chills. Fever up to 101. Question sepsis. EXAM: PORTABLE CHEST 1 VIEW COMPARISON:  None. FINDINGS: The heart size and mediastinal contours are within normal limits. No focal consolidation. No pulmonary edema. No pleural effusion. No pneumothorax. No acute osseous abnormality. IMPRESSION: No active cardiopulmonary disease. Electronically Signed   By: Tish Frederickson M.D.   On: 02/26/2020 12:48   MR ABDOMEN MRCP W WO CONTAST  Addendum Date: 02/27/2020   ADDENDUM REPORT: 02/27/2020 15:16 ADDENDUM: For clarification, there is no common duct stone seen on the current study. The possibility of biliary leak is considered given the clinical scenario and the lack of visible alternative explanation on the current exam given the presence of fluid about the gallbladder fossa, RIGHT subhepatic and perihepatic regions. Scattered pneumoperitoneum in the immediate postoperative setting. These results were called by telephone at the time of interpretation on 02/27/2020 at 3:14 pm to provider [facility] , who verbally acknowledged these results. Electronically Signed   By: Donzetta Kohut  M.D.   On: 02/27/2020 15:16   Result Date: 02/27/2020 CLINICAL DATA:  Cholelithiasis with elevated bilirubin suspect  choledocholithiasis EXAM: MRI ABDOMEN WITHOUT AND WITH CONTRAST (INCLUDING MRCP) TECHNIQUE: Multiplanar multisequence MR imaging of the abdomen was performed both before and after the administration of intravenous contrast. Heavily T2-weighted images of the biliary and pancreatic ducts were obtained, and three-dimensional MRCP images were rendered by post processing. CONTRAST:  62mL GADAVIST GADOBUTROL 1 MMOL/ML IV SOLN COMPARISON:  Ultrasound evaluation performed prior to cholecystectomy. FINDINGS: Lower chest: Basilar airspace disease bilaterally at the lung bases in a dependent distribution. No effusion. Large hiatal hernia extends into the chest approximately 50% of the stomach herniates into the chest. Hepatobiliary: Post cholecystectomy. Surgical drain enters via RIGHT flank approach and terminates beneath the RIGHT hepatic margin tracking towards the LEFT hepatic margin through the gallbladder fossa. Small amount of fluid in the gallbladder fossa without organized features. Trace perihepatic fluid in gas measuring approximately 11 mm thickness. This is with respect to the fluid. Trace amounts of pneumoperitoneum adjacent to this area tracking up over the liver and along the anterior abdomen in the peritoneal cavity following laparoscopic procedure. Common bile duct is nondilated tiny periampullary duodenal diverticulum. No filling defects suggested on MRCP sequences with mild attenuation of the distal common bile duct which again shows no sign of dilation small amounts of fluid beneath the RIGHT hemi liver as well Pancreas: Small cystic lesion in the pancreatic body 4 mm no main pancreatic ductal dilation. Mild edema about the pancreatic head, none about the body of the pancreas with normal intrinsic T1 signal in the pancreas. Spleen:  Normal in size and contour without focal lesion. Adrenals/Urinary Tract: Adrenal glands are normal. Symmetric renal enhancement. Stomach/Bowel: Gaseous distension of the colon.  No pericolonic stranding. No acute gastrointestinal process to the extent evaluated. Pelvic bowel loops not imaged. Vascular/Lymphatic: Vascular structures in the abdomen are patent with replaced hepatic arterial supply arising from SMA to the RIGHT hepatic lobe. Portal vein is patent. No aneurysmal dilation of the abdominal aorta. There is no gastrohepatic or hepatoduodenal ligament lymphadenopathy. No retroperitoneal or mesenteric lymphadenopathy. Other:  Fluid about the liver as discussed. Musculoskeletal: No suspicious bone lesions identified. IMPRESSION: 1. Post cholecystectomy. Trace amounts of pneumoperitoneum and fluid about the liver. No organized features. Given distribution of fluid while nonspecific in the postoperative state the possibility of bile leak is considered given lack of biliary duct pathology. Correlate with surgical drain output. 2. No sign of choledocholithiasis. Normal caliber biliary tree note that there is no dilation of the common bile duct. 3. Mild edema about the pancreatic head, may be postoperative, correlate with lipase to exclude the possibility of concomitant pancreatitis. 4. 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 5. Large hiatal hernia extends into the chest approximately 50% of the stomach herniates into the chest. 6. Basilar airspace disease bilaterally at the lung bases in a dependent distribution likely atelectasis. Difficult to exclude developing infection on MRI. Attention on follow-up. 7. Small cystic lesion in the pancreatic body, 4 mm. Recommend follow-up MRI at one year to ensure stability. This recommendation follows ACR consensus guidelines: Management of Incidental Pancreatic Cysts: A White Paper of the ACR Incidental Findings Committee. J Am Coll Radiol -7. A call is out to the referring provider to further discuss findings in the above case Electronically Signed: By: Donzetta Kohut M.D. On: 02/27/2020 15:02   US Abdomen Limited RUQ  Result Date:  02/26/2020 CLINICAL DATA:  Right upper  quadrant pain. EXAM: ULTRASOUND ABDOMEN LIMITED RIGHT UPPER QUADRANT COMPARISON:  No prior. FINDINGS: Gallbladder: Sludge is noted the gallbladder. Gallbladder wall is significantly thickened at 8 mm. Positive Murphy sign. Cholecystitis could present in this fashion. Common bile duct: Diameter: 4.3 mm Liver: Increased echogenicity consistent fatty infiltration or hepatocellular disease. Portal vein is patent on color Doppler imaging with normal direction of blood flow towards the liver. Other: None. IMPRESSION: 1. Gallbladder sludge is noted. Gallbladder wall is significantly thickened at 8 mm. Positive Murphy sign. Cholecystitis could present this fashion. 2. Increased hepatic echogenicity consistent fatty infiltration or hepatocellular disease. Electronically Signed   By: Maisie Fushomas  Register   On: 02/26/2020 13:40   Medications: I have reviewed the patient's current medications.  Assessment/Plan: 1) Acute acalculous cholecystitis-s/p lap chole-fluid in the fossa ?bile leak-will monitor labs closely. 2) Fatty liver. 3) Small 4 mm cystic lesion in pancreatic body-needs a follow up MRI in 1 year.  4) Large hiatal hernia with 50% of the stomach in the chest.  LOS: 1 day   Charna ElizabethJyothi Rikayla Demmon 02/28/2020, 7:08 AM

## 2020-02-29 LAB — CBC WITH DIFFERENTIAL/PLATELET
Abs Immature Granulocytes: 0.08 10*3/uL — ABNORMAL HIGH (ref 0.00–0.07)
Basophils Absolute: 0 10*3/uL (ref 0.0–0.1)
Basophils Relative: 0 %
Eosinophils Absolute: 0.3 10*3/uL (ref 0.0–0.5)
Eosinophils Relative: 3 %
HCT: 38.7 % — ABNORMAL LOW (ref 39.0–52.0)
Hemoglobin: 13.2 g/dL (ref 13.0–17.0)
Immature Granulocytes: 1 %
Lymphocytes Relative: 11 %
Lymphs Abs: 1.1 10*3/uL (ref 0.7–4.0)
MCH: 32.9 pg (ref 26.0–34.0)
MCHC: 34.1 g/dL (ref 30.0–36.0)
MCV: 96.5 fL (ref 80.0–100.0)
Monocytes Absolute: 1.2 10*3/uL — ABNORMAL HIGH (ref 0.1–1.0)
Monocytes Relative: 13 %
Neutro Abs: 6.7 10*3/uL (ref 1.7–7.7)
Neutrophils Relative %: 72 %
Platelets: 262 10*3/uL (ref 150–400)
RBC: 4.01 MIL/uL — ABNORMAL LOW (ref 4.22–5.81)
RDW: 12.5 % (ref 11.5–15.5)
WBC: 9.4 10*3/uL (ref 4.0–10.5)
nRBC: 0 % (ref 0.0–0.2)

## 2020-02-29 LAB — HEPATIC FUNCTION PANEL
ALT: 94 U/L — ABNORMAL HIGH (ref 0–44)
AST: 30 U/L (ref 15–41)
Albumin: 2.8 g/dL — ABNORMAL LOW (ref 3.5–5.0)
Alkaline Phosphatase: 138 U/L — ABNORMAL HIGH (ref 38–126)
Bilirubin, Direct: 0.7 mg/dL — ABNORMAL HIGH (ref 0.0–0.2)
Indirect Bilirubin: 1 mg/dL — ABNORMAL HIGH (ref 0.3–0.9)
Total Bilirubin: 1.7 mg/dL — ABNORMAL HIGH (ref 0.3–1.2)
Total Protein: 6.1 g/dL — ABNORMAL LOW (ref 6.5–8.1)

## 2020-02-29 MED ORDER — TRAMADOL HCL 50 MG PO TABS: 50.0000 mg | ORAL_TABLET | Freq: Four times a day (QID) | ORAL | 0 refills | Status: AC | PRN

## 2020-02-29 MED ORDER — ALUM & MAG HYDROXIDE-SIMETH 200-200-20 MG/5ML PO SUSP
15.0000 mL | Freq: Once | ORAL | Status: AC
  Administered 2020-02-29: 15 mL via ORAL
  Filled 2020-02-29: qty 30

## 2020-02-29 NOTE — Plan of Care (Signed)
All discharge instructions were given to the Pt and his spouse, demonstrated emptying of JP drain. Pt and his spouse verbalized understanding.

## 2020-02-29 NOTE — Discharge Summary (Signed)
Patient ID: Tim Russo MRN: [id number] DOB/AGE: 1965-08-28 54 y.o.  Admit date: 02/26/2020 Discharge date: 02/29/2020  Discharge Diagnoses Patient Active Problem List   Diagnosis Date Noted  . Acute calculous cholecystitis 02/27/2020  . Cholecystitis 02/26/2020    Procedures Laparoscopic cholecystectomy 02/27/20  Hospital Course: He was admitted postoperatively where he recovered well. He had an MRCP following surgery which did not reveal any definitive choledocholithiasis. His LFTs were down-trending appropriately. His JP has been relatively low volume output with bile tinge. His diet has been advanced and he has tolerated this well. Pain has been controlled. He has been deemed stable for discharge home with close follow-up in our office  AF VSS NAD, comfortable and in good spirits RRR Abdomen is soft, appropriate tenderness around incisions; JP scant ss output - slight bile tinge    Allergies as of 02/29/2020      Reactions   Penicillins Rash      Medication List    TAKE these medications   acetaminophen 500 MG tablet Commonly known as: TYLENOL Take 500 mg by mouth 4 (four) times daily as needed for mild pain, fever or headache.   cetirizine 10 MG tablet Commonly known as: ZYRTEC Take 10 mg by mouth daily.   montelukast 10 MG tablet Commonly known as: SINGULAIR Take 10 mg by mouth daily.   traMADol 50 MG tablet Commonly known as: Ultram Take 1 tablet (50 mg total) by mouth every 6 (six) hours as needed for up to 5 days (postop pain not controlled with tylenol and ibuprofen first).         Follow-up Information    Darnell Level, MD Follow up in 1 week(s).   Specialty: General Surgery Contact information: [address] [place] [address] [phone]               Stephanie Coup. Cliffton Asters, M.D. [facility]

## 2020-02-29 NOTE — Discharge Instructions (Signed)
POST OP INSTRUCTIONS  1. DIET: As tolerated. Follow a light bland diet the first 24 hours after arrival home, such as soup, liquids, crackers, etc.  Be sure to include lots of fluids daily.  Avoid fast food or heavy meals as your are more likely to get nauseated.  Eat a low fat the next few days after surgery.  2. Take your usually prescribed home medications unless otherwise directed.  3. PAIN CONTROL: a. Pain is best controlled by a usual combination of three different methods TOGETHER: i. Ice/Heat ii. Over the counter pain medication iii. Prescription pain medication b. Most patients will experience some swelling and bruising around the surgical site.  Ice packs or heating pads (30-60 minutes up to 6 times a day) will help. Some people prefer to use ice alone, heat alone, alternating between ice & heat.  Experiment to what works for you.  Swelling and bruising can take several weeks to resolve.   c. It is helpful to take an over-the-counter pain medication regularly for the first few weeks: i. Ibuprofen (Motrin/Advil) - 200mg  tabs - take 3 tabs (600mg ) every 6 hours as needed for pain ii. Acetaminophen (Tylenol) - you may take 650mg  every 6 hours as needed. You can take this with motrin as they act differently on the body. If you are taking a narcotic pain medication that has acetaminophen in it, do not take over the counter tylenol at the same time.  Iii. NOTE: You may take both of these medications together - most patients  find it most helpful when alternating between the two (i.e. Ibuprofen at 6am,  tylenol at 9am, ibuprofen at 12pm ...) d. A  prescription for pain medication should be given to you upon discharge.  Take your pain medication as prescribed if your pain is not adequatly controlled with the over-the-counter pain reliefs mentioned above.  4. Avoid getting constipated.  Between the surgery and the pain medications, it is common to experience some constipation.  Increasing fluid  intake and taking a fiber supplement (such as Metamucil, Citrucel, FiberCon, MiraLax, etc) 1-2 times a day regularly will usually help prevent this problem from occurring.  A mild laxative (prune juice, Milk of Magnesia, MiraLax, etc) should be taken according to package directions if there are no bowel movements after 48 hours.    5. Drain: Empty/record twice daily - record output in mL if able and color of fluid. Bring this log with you to your follow-up appointment. Try to cover drain site while showering to keep skin insertion point clean/dry. Your other incisions are waterproof to a shower at this point.  6. ACTIVITIES as tolerated:   a. Avoid heavy lifting (>10lbs or 1 gallon of milk) for the next 6 weeks. b. You may resume regular (light) daily activities beginning the next day--such as daily self-care, walking, climbing stairs--gradually increasing activities as tolerated.  If you can walk 30 minutes without difficulty, it is safe to try more intense activity such as jogging, treadmill, bicycling, low-impact aerobics.  c. DO NOT PUSH THROUGH PAIN.  Let pain be your guide: If it hurts to do something, don't do it. d. may drive when you are no longer taking prescription pain medication, you can comfortably wear a seatbelt, and you can safely maneuver your car and apply brakes.   7. FOLLOW UP in our office a. Please call [facility] at [phone] to set up an appointment to see your surgeon in the office for a follow-up appointment approximately 2  weeks after your surgery. b. Make sure that you call for this appointment the day you arrive home to insure a convenient appointment time.  9. If you have disability or family leave forms that need to be completed, you may have them completed by your primary care physician's office; for return to work instructions, please ask our office staff and they will be happy to assist you in obtaining this documentation   When to call us [phone]  [phone]: 1. Poor pain control 2. Reactions / problems with new medications (rash/itching, etc)  3. Fever over 101.5 F (38.5 C) 4. Inability to urinate 5. Nausea/vomiting 6. Worsening swelling or bruising 7. Continued bleeding from incision. 8. Increased pain, redness, or drainage from the incision  The clinic staff is available to answer your questions during regular business hours (8:30am-5pm).  Please don't hesitate to call and ask to speak to one of our nurses for clinical concerns.   A surgeon from [facility] is always on call at the hospitals   If you have a medical emergency, go to the nearest emergency room or call 911.  [facility] [address], [address], [place], [place]  [address] MAIN: [phone] FAX: [phone] [email or web address]

## 2020-03-01 LAB — SURGICAL PATHOLOGY

## 2020-03-02 LAB — CULTURE, BLOOD (SINGLE)
Culture: NO GROWTH
Special Requests: ADEQUATE

## 2020-05-28 ENCOUNTER — Ambulatory Visit: Attending: Internal Medicine

## 2020-05-28 ENCOUNTER — Other Ambulatory Visit (HOSPITAL_BASED_OUTPATIENT_CLINIC_OR_DEPARTMENT_OTHER): Payer: Self-pay | Admitting: Internal Medicine

## 2020-05-28 DIAGNOSIS — Z23 Encounter for immunization: Secondary | ICD-10-CM

## 2020-05-28 NOTE — Progress Notes (Signed)
   Covid-19 Vaccination Clinic  Name:  Taj Nevins    MRN: [id number] DOB: [birth year]  05/28/2020  Mr. Plantz was observed post Covid-19 immunization for 15 minutes without incident. He was provided with Vaccine Information Sheet and instruction to access the V-Safe system.   Mr. Merlo was instructed to call 911 with any severe reactions post vaccine: Marland Kitchen Difficulty breathing  . Swelling of face and throat  . A fast heartbeat  . A bad rash all over body  . Dizziness and weakness   Immunizations Administered    No immunizations on file.

## 2020-06-01 MED FILL — MODERNA COVID-19 VACCINE 10: 100 | 1 days supply | Qty: 0 | Fill #0

## 2020-06-25 ENCOUNTER — Encounter (HOSPITAL_COMMUNITY): Payer: Self-pay | Admitting: Radiology

## 2020-06-25 ENCOUNTER — Other Ambulatory Visit (HOSPITAL_COMMUNITY): Payer: Self-pay | Admitting: Surgery

## 2020-06-25 DIAGNOSIS — K750 Abscess of liver: Secondary | ICD-10-CM

## 2020-06-25 NOTE — Progress Notes (Signed)
Tim Prochnow "[name]" Male, 55 y.o., Aug 02, 1965  MRN:  [id number] Phone:  [phone] [name])       PCP:  Patient, No Pcp Per Coverage:  [name]  Next Appt With Radiology ([facility]-CT 3) 06/29/2020 at 8:00 AM           RE: CT Guided Drain Placement Received: Today [name], MD  [name]  Ok   CT aspiration RUQ collection  I called [name]. Told him unlikely we could fit drain. See how it goes. Try not to cross pleura! Maybe LLD positioning to bring liver down?  Send for culture. Aspirate all you can. Prob dropped stones from chole.   DDH        Previous Messages   ----- Message -----  From: [name]  Sent: 06/25/2020  3:11 PM EST  To: Ir Procedure Requests  Subject: CT Guided Drain Placement             Procedure: CT IMAGE GUIDED DRAINAGE BY PERCUTANEOUS CATHETER   Reason: Liver abscess   History: Outside MRI in PAC's, done on 06/23/2020   Provider: [name]   Provider Contact: [phone]

## 2020-06-28 ENCOUNTER — Other Ambulatory Visit: Payer: Self-pay | Admitting: Student

## 2020-06-29 ENCOUNTER — Encounter (HOSPITAL_COMMUNITY): Payer: Self-pay

## 2020-06-29 ENCOUNTER — Ambulatory Visit (HOSPITAL_COMMUNITY)
Admission: RE | Admit: 2020-06-29 | Discharge: 2020-06-29 | Disposition: A | Discharge status: Home / Self Care | Source: Ambulatory Visit | Attending: Surgery | Admitting: Surgery

## 2020-06-29 ENCOUNTER — Ambulatory Visit (HOSPITAL_COMMUNITY)

## 2020-06-29 ENCOUNTER — Other Ambulatory Visit: Payer: Self-pay

## 2020-06-29 ENCOUNTER — Other Ambulatory Visit (HOSPITAL_COMMUNITY): Payer: Self-pay | Admitting: Surgery

## 2020-06-29 DIAGNOSIS — K750 Abscess of liver: Secondary | ICD-10-CM

## 2020-06-29 LAB — CBC
HCT: 47.2 % (ref 39.0–52.0)
Hemoglobin: 15.7 g/dL (ref 13.0–17.0)
MCH: 30.3 pg (ref 26.0–34.0)
MCHC: 33.3 g/dL (ref 30.0–36.0)
MCV: 90.9 fL (ref 80.0–100.0)
Platelets: 449 10*3/uL — ABNORMAL HIGH (ref 150–400)
RBC: 5.19 MIL/uL (ref 4.22–5.81)
RDW: 12.6 % (ref 11.5–15.5)
WBC: 8.2 10*3/uL (ref 4.0–10.5)
nRBC: 0 % (ref 0.0–0.2)

## 2020-06-29 LAB — PROTIME-INR
INR: 1.1 (ref 0.8–1.2)
Prothrombin Time: 13.9 seconds (ref 11.4–15.2)

## 2020-06-29 MED ORDER — FENTANYL CITRATE (PF) 100 MCG/2ML IJ SOLN
INTRAMUSCULAR | Status: AC
  Filled 2020-06-29: qty 2

## 2020-06-29 MED ORDER — FENTANYL CITRATE (PF) 100 MCG/2ML IJ SOLN
INTRAMUSCULAR | Status: AC | PRN
Start: 2020-06-29 — End: 2020-06-29
  Administered 2020-06-29: 25 ug via INTRAVENOUS
  Administered 2020-06-29: 50 ug via INTRAVENOUS
  Administered 2020-06-29: 25 ug via INTRAVENOUS

## 2020-06-29 MED ORDER — MIDAZOLAM HCL 2 MG/2ML IJ SOLN
INTRAMUSCULAR | Status: AC | PRN
  Administered 2020-06-29: 2 mg via INTRAVENOUS

## 2020-06-29 MED ORDER — OXYCODONE-ACETAMINOPHEN 5-325 MG PO TABS: 1.0000 | ORAL_TABLET | Freq: Four times a day (QID) | ORAL | 0 refills | Status: DC | PRN

## 2020-06-29 MED ORDER — MIDAZOLAM HCL 2 MG/2ML IJ SOLN
INTRAMUSCULAR | Status: AC
  Filled 2020-06-29: qty 2

## 2020-06-29 MED ORDER — SODIUM CHLORIDE 0.9 % IV SOLN: INTRAVENOUS | Status: DC

## 2020-06-29 MED ORDER — NAPROXEN SODIUM 550 MG PO TABS: 550.0000 mg | ORAL_TABLET | Freq: Two times a day (BID) | ORAL | 0 refills | Status: AC

## 2020-06-29 MED ORDER — LIDOCAINE HCL (PF) 1 % IJ SOLN
INTRAMUSCULAR | Status: AC
  Filled 2020-06-29: qty 30

## 2020-06-29 NOTE — H&P (Signed)
Chief Complaint: Patient was seen in consultation today for liver abscess aspiration/drain placement at the request of Martin,Matthew  Referring Physician(s): Martin,Matthew  Supervising Physician: Malachy Moan  Patient Status: [facility] - Out-pt  History of Present Illness: Tim Russo is a 56 y.o. male   Perc cholecystectomy 02/27/20 Dr Gerrit Friends Did well post procedure til recently Developed pain Rt flank just last week  Jun 23, 2020 MRI:  IMPRESSION: 1. Ill-defined 3.3 x 2.5 cm extracapsular mass centered along the posterior right hemidiaphragm with thick irregular enhancing wall and central fluid density with restricted diffusion, new since 02/27/2020 MRI, stable since recent 06/17/2020 chest CT angiogram, favor a poorly defined abscess arising from trans-diaphragmatic spread of infection from the right pleural space. Ill-defined reactive edema in the posterior right liver adjacent to the suspected abscess. Follow-up post treatment CT or MRI imaging recommended.  Dr Sheron Nightingale has requested aspiration vs drain placement Dr Deanne Coffer has reviewed imaging and approves procedure Scheduled today for same   History reviewed. No pertinent past medical history.  Past Surgical History:  Procedure Laterality Date  . CHOLECYSTECTOMY N/A 02/27/2020   Procedure: LAPAROSCOPIC CHOLECYSTECTOMY;  Surgeon: Darnell Level, MD;  Location: [facility] ORS;  Service: General;  Laterality: N/A;    Allergies: Penicillins  Medications: Prior to Admission medications   Medication Sig Start Date End Date Taking? Authorizing Provider  cetirizine (ZYRTEC) 10 MG tablet Take 10 mg by mouth daily.   Yes [provider]  levofloxacin (LEVAQUIN) 750 MG tablet Take 750 mg by mouth daily. 06/23/20  Yes [provider]  montelukast (SINGULAIR) 10 MG tablet Take 10 mg by mouth daily. 01/25/20  Yes [provider]  oxyCODONE (OXY IR/ROXICODONE) 5 MG immediate release tablet Take 5 mg by  mouth daily as needed for severe pain.   Yes [provider]     Family History  Problem Relation Age of Onset  . Emphysema Father     Social History   Socioeconomic History  . Marital status: Married    Spouse name: Not on file  . Number of children: Not on file  . Years of education: Not on file  . Highest education level: Not on file  Occupational History  . Not on file  Tobacco Use  . Smoking status: Never Smoker  . Smokeless tobacco: Never Used  Vaping Use  . Vaping Use: Never used  Substance and Sexual Activity  . Alcohol use: Yes    Alcohol/week: 1.0 standard drink    Types: 1 Glasses of wine per week    Comment: daily  . Drug use: Never  . Sexual activity: Not on file  Other Topics Concern  . Not on file  Social History Narrative  . Not on file   Social Determinants of Health   Financial Resource Strain: Not on file  Food Insecurity: Not on file  Transportation Needs: Not on file  Physical Activity: Not on file  Stress: Not on file  Social Connections: Not on file    Review of Systems: A 12 point ROS discussed and pertinent positives are indicated in the HPI above.  All other systems are negative.  Review of Systems  Constitutional: Negative for activity change and fatigue.  Respiratory: Negative for cough and shortness of breath.   Cardiovascular: Negative for chest pain.  Gastrointestinal: Negative for abdominal pain and nausea.  Genitourinary: Positive for flank pain.       Rt flank pain   Psychiatric/Behavioral: Negative for behavioral problems and confusion.  Vital Signs: BP (!) 116/93   Pulse 72   Temp 98 F (36.7 C) (Oral)   Resp 16   Ht 5\' 8"  (1.727 m)   Wt 164 lb (74.4 kg)   SpO2 98%   BMI 24.94 kg/m   Physical Exam Vitals reviewed.  HENT:     Mouth/Throat:     Mouth: Mucous membranes are moist.  Cardiovascular:     Rate and Rhythm: Normal rate and regular rhythm.     Heart sounds: Normal heart sounds.   Pulmonary:     Effort: Pulmonary effort is normal.     Breath sounds: Normal breath sounds.  Abdominal:     General: Bowel sounds are normal.     Palpations: Abdomen is soft.     Tenderness: There is no abdominal tenderness.  Musculoskeletal:        General: Normal range of motion.  Skin:    General: Skin is warm.  Neurological:     Mental Status: He is alert and oriented to person, place, and time.  Psychiatric:        Behavior: Behavior normal.     Imaging: No results found.  Labs:  CBC: Recent Labs    02/27/20 0316 02/28/20 0329 02/29/20 0309 06/29/20 0555  WBC 8.3 13.4* 9.4 8.2  HGB 13.9 14.6 13.2 15.7  HCT 41.3 42.7 38.7* 47.2  PLT 215 265 262 449*    COAGS: Recent Labs    02/26/20 1245  INR 1.1  APTT 29    BMP: Recent Labs    02/26/20 1245 02/27/20 0316 02/28/20 0329  NA 133* 134* 137  K 3.7 4.0 4.7  CL 98 103 98  CO2 24 24 27   GLUCOSE 101* 117* 111*  BUN 16 12 12   CALCIUM 9.0 8.3* 9.2  CREATININE 0.87 0.73 0.75  GFRNONAA >60 >60 >60  GFRAA >60 >60 >60    LIVER FUNCTION TESTS: Recent Labs    02/26/20 1245 02/27/20 0316 02/28/20 0329 02/29/20 0309  BILITOT 1.6* 5.4* 3.1* 1.7*  AST 12* 169* 80* 30  ALT 18 152* 153* 94*  ALKPHOS 70 192* 164* 138*  PROT 7.4 6.0* 6.6 6.1*  ALBUMIN 3.7 2.8* 3.2* 2.8*    TUMOR MARKERS: No results for input(s): AFPTM, CEA, CA199, CHROMGRNA in the last 8760 hours.  Assessment and Plan:  Perc chole 02/27/20 Did well until just last week- developed Rt flank pain MRI does reveal  Hepatic fluid collection Scheduled now for aspiration vs drain placement Risks and benefits discussed with the patient including bleeding, infection, damage to adjacent structures, and sepsis.  All of the patient's questions were answered, patient is agreeable to proceed. Consent signed and in chart.  Thank you for this interesting consult.  I greatly enjoyed meeting Tim Russo and look forward to participating in their  care.  A copy of this report was sent to the requesting provider on this date.  Electronically Signed: 04/30/20, PA-C 06/29/2020, 6:47 AM   I spent a total of  30 Minutes   in face to face in clinical consultation, greater than 50% of which was counseling/coordinating care for hepatic abscess aspiration/drain placement

## 2020-06-29 NOTE — Discharge Instructions (Addendum)
Liver Abscess  A liver abscess is an infected area inside the liver that contains a collection of pus. The liver is a large organ in the upper right side of the abdomen. It has many important functions, including storing energy, producing fluids that the body needs, and removing harmful substances from the bloodstream. A liver abscess can be dangerous if not treated. What are the causes? This condition may be caused by:  A bacterial infection.  An infection with a parasite called an amoeba (Entamoeba histolytica).  An infection with a fungus called Candida. This is rare. A liver abscess can occur:  When infections spread to the liver from other parts of the abdomen, such as the appendix (appendicitis), the large intestine (diverticulitis), or the gallbladder (cholecystitis).  When infections spread to the liver from other parts of the body through the bloodstream.  After abdominal surgery or a penetrating injury to the abdomen. What increases the risk? You are more likely to develop this condition if you:  Have diabetes.  Have a liver disease (cirrhosis) in which healthy liver tissue is replaced by scar tissue.  Have a weakened disease-fighting system (immune system).  Take a specific type of medicine to reduce stomach acid (proton pump inhibitor).  Are older.  Are male. What are the signs or symptoms? Symptoms of this condition often come on slowly over many days or a few weeks. The most common symptoms are:  Fever.  Pain in the upper right side of the abdomen. Other symptoms include:  A general ill feeling (malaise).  Chills.  Nausea and vomiting.  Loss of appetite.  Diarrhea.  Weight loss. How is this diagnosed? This condition may be diagnosed based on:  Your symptoms, your medical history, and a physical exam.  Blood tests to check for infections.  Imaging tests, such as a CT scan or ultrasound. Imaging tests are often the best way to find an  abscess.  Needle aspiration. In this procedure, a long needle is put through the skin and into the liver. A sample of pus is drained out through the needle. The sample is studied under a microscope, and a culture test is done. How is this treated? Treatment for this condition depends on the cause of the infection.  If the abscess was caused by bacteria, treatment usually involves having the pus drained and taking antibiotic medicines. ? You may need to be hospitalized and given antibiotics directly into a vein through an IV. ? The pus may be drained from the abscess through needle aspiration or by making an incision in the abscess.  If the abscess was caused by an amoeba or a fungus, drainage of the abscess is usually not needed. Antimicrobial medicines are normally used to treat those infections. Follow these instructions at home: Medicines  Take over-the-counter and prescription medicines only as told by your health care provider.  If you were prescribed an antibiotic medicine, take it as told by your health care provider. Do not stop using the antibiotic even if you start to feel better. General instructions  Rest as much as possible and get plenty of sleep.  Avoid activities that take a lot of effort.  Return to your normal activities as told by your health care provider. Ask your health care provider what activities are safe for you.  Do not lift anything that is heavier than 10 lb (4.5 kg), or the limit that you are told, until your health care provider says that it is safe.  Do  not drink alcohol until your health care provider approves.  Keep all follow-up visits. This is important.   Contact a health care provider if:  Your symptoms get worse, or you develop any of the following symptoms: ? Pain or swelling in your abdomen. ? Loss of appetite. ? Nausea or vomiting. ? Diarrhea. ? Fever, chills, or sweats. ? Your skin or the white parts of your eyes turn yellow  (jaundice). Get help right away if:  Your abdominal pain suddenly gets worse.  You develop confusion. These symptoms may represent a serious problem that is an emergency. Do not wait to see if the symptoms will go away. Get medical help right away. Call your local emergency services (911 in the [place] Do not drive yourself to the hospital. Summary  A liver abscess is an infected area inside the liver that contains a collection of pus.  Liver abscesses often cause abdominal pain, fever, and other symptoms. A liver abscess can be dangerous if not treated.  If the abscess was caused by bacteria, treatment usually involves having the pus drained and taking antibiotic medicines.  If the abscess was caused by an amoeba or a fungus, drainage of the abscess is usually not needed. Antimicrobial medicines are normally used to treat those infections.  Get help right away if your abdominal pain suddenly gets worse or you develop confusion. This information is not intended to replace advice given to you by your health care provider. Make sure you discuss any questions you have with your health care provider. Document Revised: 03/31/2020 Document Reviewed: 03/31/2020 Elsevier Patient Education  2021 Elsevier Inc.  Moderate Conscious Sedation, Adult, Care After This sheet gives you information about how to care for yourself after your procedure. Your health care provider may also give you more specific instructions. If you have problems or questions, contact your health care provider. What can I expect after the procedure? After the procedure, it is common to have:  Sleepiness for several hours.  Impaired judgment for several hours.  Difficulty with balance.  Vomiting if you eat too soon. Follow these instructions at home: For the time period you were told by your health care provider:  Rest.  Do not participate in activities where you could fall or become injured.  Do not drive or use  machinery.  Do not drink alcohol.  Do not take sleeping pills or medicines that cause drowsiness.  Do not make important decisions or sign legal documents.  Do not take care of children on your own.      Eating and drinking  Follow the diet recommended by your health care provider.  Drink enough fluid to keep your urine pale yellow.  If you vomit: ? Drink water, juice, or soup when you can drink without vomiting. ? Make sure you have little or no nausea before eating solid foods.   General instructions  Take over-the-counter and prescription medicines only as told by your health care provider.  Have a responsible adult stay with you for the time you are told. It is important to have someone help care for you until you are awake and alert.  Do not smoke.  Keep all follow-up visits as told by your health care provider. This is important. Contact a health care provider if:  You are still sleepy or having trouble with balance after 24 hours.  You feel light-headed.  You keep feeling nauseous or you keep vomiting.  You develop a rash.  You have a fever.  You have redness or swelling around the IV site. Get help right away if:  You have trouble breathing.  You have new-onset confusion at home. Summary  After the procedure, it is common to feel sleepy, have impaired judgment, or feel nauseous if you eat too soon.  Rest after you get home. Know the things you should not do after the procedure.  Follow the diet recommended by your health care provider and drink enough fluid to keep your urine pale yellow.  Get help right away if you have trouble breathing or new-onset confusion at home. This information is not intended to replace advice given to you by your health care provider. Make sure you discuss any questions you have with your health care provider. Document Revised: 10/03/2019 Document Reviewed: 05/01/2019 Elsevier Patient Education  2021 [employer].

## 2020-06-29 NOTE — Procedures (Signed)
Interventional Radiology Procedure Note  Procedure: US guided aspiration of focal fluid collection posterior to the liver.   Complications: None  Estimated Blood Loss: None  Recommendations: - Sent for cultures - DC home in 1 hour    Signed,  Sterling Big, MD

## 2020-07-04 LAB — AEROBIC/ANAEROBIC CULTURE W GRAM STAIN (SURGICAL/DEEP WOUND): Special Requests: NORMAL

## 2020-10-12 ENCOUNTER — Other Ambulatory Visit: Payer: Self-pay

## 2020-11-25 ENCOUNTER — Encounter: Payer: Self-pay | Admitting: Hematology and Oncology

## 2020-11-25 ENCOUNTER — Inpatient Hospital Stay: Attending: Hematology and Oncology | Admitting: Hematology and Oncology

## 2020-11-25 ENCOUNTER — Other Ambulatory Visit: Payer: Self-pay

## 2020-11-25 ENCOUNTER — Inpatient Hospital Stay

## 2020-11-25 DIAGNOSIS — D751 Secondary polycythemia: Secondary | ICD-10-CM

## 2020-11-25 DIAGNOSIS — M129 Arthropathy, unspecified: Secondary | ICD-10-CM | POA: Insufficient documentation

## 2020-11-25 DIAGNOSIS — K449 Diaphragmatic hernia without obstruction or gangrene: Secondary | ICD-10-CM | POA: Diagnosis not present

## 2020-11-25 LAB — CBC WITH DIFFERENTIAL (CANCER CENTER ONLY)
Abs Immature Granulocytes: 0.01 10*3/uL (ref 0.00–0.07)
Basophils Absolute: 0.1 10*3/uL (ref 0.0–0.1)
Basophils Relative: 1 %
Eosinophils Absolute: 0.1 10*3/uL (ref 0.0–0.5)
Eosinophils Relative: 2 %
HCT: 47.4 % (ref 39.0–52.0)
Hemoglobin: 16.5 g/dL (ref 13.0–17.0)
Immature Granulocytes: 0 %
Lymphocytes Relative: 32 %
Lymphs Abs: 1.9 10*3/uL (ref 0.7–4.0)
MCH: 32.2 pg (ref 26.0–34.0)
MCHC: 34.8 g/dL (ref 30.0–36.0)
MCV: 92.4 fL (ref 80.0–100.0)
Monocytes Absolute: 0.6 10*3/uL (ref 0.1–1.0)
Monocytes Relative: 10 %
Neutro Abs: 3.3 10*3/uL (ref 1.7–7.7)
Neutrophils Relative %: 55 %
Platelet Count: 224 10*3/uL (ref 150–400)
RBC: 5.13 MIL/uL (ref 4.22–5.81)
RDW: 12.9 % (ref 11.5–15.5)
WBC Count: 6 10*3/uL (ref 4.0–10.5)
nRBC: 0 % (ref 0.0–0.2)

## 2020-11-25 LAB — CMP (CANCER CENTER ONLY)
ALT: 26 U/L (ref 0–44)
AST: 16 U/L (ref 15–41)
Albumin: 4.1 g/dL (ref 3.5–5.0)
Alkaline Phosphatase: 97 U/L (ref 38–126)
Anion gap: 9 (ref 5–15)
BUN: 16 mg/dL (ref 6–20)
CO2: 27 mmol/L (ref 22–32)
Calcium: 9.5 mg/dL (ref 8.9–10.3)
Chloride: 106 mmol/L (ref 98–111)
Creatinine: 0.94 mg/dL (ref 0.61–1.24)
GFR, Estimated: >60 mL/min (ref >60)
Glucose, Bld: 103 mg/dL — ABNORMAL HIGH (ref 70–99)
Potassium: 3.8 mmol/L (ref 3.5–5.1)
Sodium: 142 mmol/L (ref 135–145)
Total Bilirubin: 1 mg/dL (ref 0.3–1.2)
Total Protein: 7.1 g/dL (ref 6.5–8.1)

## 2020-11-25 LAB — RETIC PANEL
Immature Retic Fract: 7 % (ref 2.3–15.9)
RBC.: 5.11 MIL/uL (ref 4.22–5.81)
Retic Count, Absolute: 80.7 10*3/uL (ref 19.0–186.0)
Retic Ct Pct: 1.6 % (ref 0.4–3.1)
Reticulocyte Hemoglobin: 37.7 pg (ref >27.9)

## 2020-11-25 NOTE — Progress Notes (Signed)
[place] [phone]   Fax:([phone]) [place] NOTE  Patient Care Team: Berkley Harvey, NP as PCP - General (Nurse Practitioner)  Hematological/Oncological History # Polycythemia, Unclear Etiology 11/25/2020: establish care with Dr. Lorenso Courier  CHIEF COMPLAINTS/PURPOSE OF CONSULTATION:  "Elevated RBC "  HISTORY OF PRESENTING ILLNESS:  Tim Russo 55 y.o. male with medical history significant for arthritis, borderline HLD, allergic rhinitis, and a hiatal hernia who presents for evaluation for polycythemia.   On review of the previous records Dr. Gena Fray has a longstanding history of elevated and borderline elevated hemoglobin.  He was noted to have a hemoglobin of 18.1 on 03/26/2019, and a hemoglobin of 17.7 on 06/06/2019.  Most recently on 10/26/2020 the patient was found to have a hemoglobin of 18.1.  He has had intermittently high hemoglobins in the 16-17 range throughout that time.  Due to concern for the elevated red blood cell count the patient was referred to hematology for further evaluation management.  On exam today Dr. Gena Fray reports that he underwent a sleep study in 2021 which was perfectly normal.  He reports he does smoke approximately 1 to 2 cigars/year but otherwise does not use any cigarettes.  He denies using any testosterone gels or testosterone injections.  He does endorse having a family history for increased hemoglobin with that being present in his father and brother.  His brother also had a provoked DVT after prolonged air travel from [place].  The family history is also remarkable for laryngeal cancer in his father and colon cancer in his paternal grandfather.  His sister was also recently diagnosed with ductal carcinoma in situ.  He denies having any itching or tendency to flush.  He notes that he is of English/Irish descent.  He otherwise denies any fevers, chills, sweats, nausea, vomiting or diarrhea.  A full 10 point ROS is listed  below.  MEDICAL HISTORY:  Past Medical History:  Diagnosis Date   Angioedema    Hiatal hernia    Pancreatic cyst    Seborrheic dermatitis    Tinnitus     SURGICAL HISTORY: Past Surgical History:  Procedure Laterality Date   CHOLECYSTECTOMY N/A 02/27/2020   Procedure: LAPAROSCOPIC CHOLECYSTECTOMY;  Surgeon: Armandina Gemma, MD;  Location: [facility];  Service: General;  Laterality: N/A;   hyperlipidemia     LAPAROSCOPIC NISSEN FUNDOPLICATION  8127   PROFUNDUS TENDON REPAIR  1973   VASECTOMY  1999    SOCIAL HISTORY: Social History   Socioeconomic History   Marital status: Married    Spouse name: Not on file   Number of children: Not on file   Years of education: Not on file   Highest education level: Not on file  Occupational History   Not on file  Tobacco Use   Smoking status: Never   Smokeless tobacco: Never  Vaping Use   Vaping Use: Never used  Substance and Sexual Activity   Alcohol use: Yes    Alcohol/week: 1.0 standard drink    Types: 1 Glasses of wine per week    Comment: daily   Drug use: Never   Sexual activity: Not on file  Other Topics Concern   Not on file  Social History Narrative   Not on file   Social Determinants of Health   Financial Resource Strain: Not on file  Food Insecurity: Not on file  Transportation Needs: Not on file  Physical Activity: Not on file  Stress: Not on file  Social Connections: Not on  file  Intimate Partner Violence: Not on file    FAMILY HISTORY: Family History  Problem Relation Age of Onset   Emphysema Father    Polycythemia Father    Breast cancer Sister    Polycythemia Brother    Deep vein thrombosis Brother    Colon cancer Paternal Grandfather     ALLERGIES:  is allergic to lisinopril and penicillins.  MEDICATIONS:  Current Outpatient Medications  Medication Sig Dispense Refill   cetirizine (ZYRTEC) 10 MG tablet Take 10 mg by mouth daily.     montelukast (SINGULAIR) 10 MG tablet Take 10 mg by mouth daily.      No current facility-administered medications for this visit.    REVIEW OF SYSTEMS:   Constitutional: ( - ) fevers, ( - )  chills , ( - ) night sweats Eyes: ( - ) blurriness of vision, ( - ) double vision, ( - ) watery eyes Ears, nose, mouth, throat, and face: ( - ) mucositis, ( - ) sore throat Respiratory: ( - ) cough, ( - ) dyspnea, ( - ) wheezes Cardiovascular: ( - ) palpitation, ( - ) chest discomfort, ( - ) lower extremity swelling Gastrointestinal:  ( - ) nausea, ( - ) heartburn, ( - ) change in bowel habits Skin: ( - ) abnormal skin rashes Lymphatics: ( - ) new lymphadenopathy, ( - ) easy bruising Neurological: ( - ) numbness, ( - ) tingling, ( - ) new weaknesses Behavioral/Psych: ( - ) mood change, ( - ) new changes  All other systems were reviewed with the patient and are negative.  PHYSICAL EXAMINATION:  There were no vitals filed for this visit. There were no vitals filed for this visit.  GENERAL: well appearing middle aged Caucasian male in NAD  SKIN: skin color, texture, turgor are normal, no rashes or significant lesions EYES: conjunctiva are pink and non-injected, sclera clear LUNGS: clear to auscultation and percussion with normal breathing effort HEART: regular rate & rhythm and no murmurs and no lower extremity edema Musculoskeletal: no cyanosis of digits and no clubbing  PSYCH: alert & oriented x 3, fluent speech NEURO: no focal motor/sensory deficits  LABORATORY DATA:  I have reviewed the data as listed CBC Latest Ref Rng & Units 11/25/2020 06/29/2020 02/29/2020  WBC 4.0 - 10.5 K/uL 6.0 8.2 9.4  Hemoglobin 13.0 - 17.0 g/dL 16.5 15.7 13.2  Hematocrit 39.0 - 52.0 % 47.4 47.2 38.7(L)  Platelets 150 - 400 K/uL 224 449(H) 262    CMP Latest Ref Rng & Units 11/25/2020 02/29/2020 02/28/2020  Glucose 70 - 99 mg/dL 103(H) - 111(H)  BUN 6 - 20 mg/dL 16 - 12  Creatinine 0.61 - 1.24 mg/dL 0.94 - 0.75  Sodium 135 - 145 mmol/L 142 - 137  Potassium 3.5 - 5.1 mmol/L 3.8 -  4.7  Chloride 98 - 111 mmol/L 106 - 98  CO2 22 - 32 mmol/L 27 - 27  Calcium 8.9 - 10.3 mg/dL 9.5 - 9.2  Total Protein 6.5 - 8.1 g/dL 7.1 6.1(L) 6.6  Total Bilirubin 0.3 - 1.2 mg/dL 1.0 1.7(H) 3.1(H)  Alkaline Phos 38 - 126 U/L 97 138(H) 164(H)  AST 15 - 41 U/L 16 30 80(H)  ALT 0 - 44 U/L 26 94(H) 153(H)    RADIOGRAPHIC STUDIES: No results found.  ASSESSMENT & PLAN Tim Russo 55 y.o. male with medical history significant for arthritis, borderline HLD, allergic rhinitis, and a hiatal hernia who presents for evaluation for polycythemia.   After  review of the labs, review of the records, and discussion with the patient the patients findings are most consistent with a polycythemia of unclear etiology.  The patient does not have any history of obstructive sleep apnea or smoking.  He does have a family history markable for polycythemia which may be due to a familial mutation.  Her work-up today will consist of a myeloproliferative neoplasm panel with JAK2 as well as BCR/ABL.  Additionally we will order erythropoietin CBC, and CMP.  In the event the patient has a mutation we will need to perform a bone marrow biopsy.  If there is a mutation of unclear significance or if there is no mutation would recommend follow-up on an as-needed basis.  #Polycythemia, Unclear Etiology -- Today we will order JAK2 panel with reflex as well as BCR/ABL FISH -- Today we will order baseline CBC, CMP, and erythropoietin level --Return to clinic pending the results of the above blood work.  Orders Placed This Encounter  Procedures   CBC with Differential ([facility] Only)    Standing Status:   Future    Number of Occurrences:   1    Standing Expiration Date:   11/25/2021   CMP ([place] only)    Standing Status:   Future    Number of Occurrences:   1    Standing Expiration Date:   11/25/2021   Retic Panel    Standing Status:   Future    Number of Occurrences:   1    Standing Expiration Date:   11/25/2021    JAK2 (INCLUDING V617F AND EXON 12), MPL,& CALR W/RFL MPN PANEL ([facility])    Standing Status:   Future    Number of Occurrences:   1    Standing Expiration Date:   11/25/2021   BCR ABL1 FISH ([facility])    Standing Status:   Future    Number of Occurrences:   1    Standing Expiration Date:   11/25/2021   Erythropoietin    Standing Status:   Future    Number of Occurrences:   1    Standing Expiration Date:   11/25/2021    All questions were answered. The patient knows to call the clinic with any problems, questions or concerns.  A total of more than 45 minutes were spent on this encounter with face-to-face time and non-face-to-face time, including preparing to see the patient, ordering tests and/or medications, counseling the patient and coordination of care as outlined above.   Ledell Peoples, MD Department of Hematology/Oncology [place] at [facility] Phone: [phone] Pager: [phone] Email: [email or web address]  11/25/2020 4:11 PM

## 2020-11-26 LAB — ERYTHROPOIETIN: Erythropoietin: 10.4 m[IU]/mL (ref 2.6–18.5)

## 2020-12-06 LAB — BCR ABL1 FISH (GENPATH)

## 2020-12-06 LAB — JAK2 (INCLUDING V617F AND EXON 12), MPL,& CALR W/RFL MPN PANEL (NGS)

## 2020-12-08 ENCOUNTER — Telehealth: Payer: Self-pay | Admitting: *Deleted

## 2020-12-08 NOTE — Telephone Encounter (Signed)
Attempted to reach patient to go over his lab results per Dr Derek Mound request.    Left message pending call back to explain further.

## 2020-12-08 NOTE — Telephone Encounter (Signed)
-----   Message from Kyra Searles, RN sent at 12/08/2020  1:58 PM EDT -----  ----- Message ----- From: Jaci Standard, MD Sent: 12/08/2020   1:23 PM EDT To: Kyra Searles, RN  Please let Dr. Veto Kemps know that his genetic testing was all normal. There were no signs of a genetic cause of his elevated Hgb. His Hgb was normal on our last exam. Overall his bloodwork was unremarkable. There is no need to see him back unless his counts were to worsen or change.  ----- Message ----- From: Leory Plowman, Lab [facility] [place] Sent: 11/25/2020   3:26 PM EDT To: Jaci Standard, MD

## 2020-12-09 NOTE — Telephone Encounter (Signed)
Received call back from patient in regards to his recent lab results. Reviewed his labs with him and advised that his labs were normal. Pt voiced unerstanding

## 2021-05-12 IMAGING — US US FINE NEEDLE ASP 1ST LESION
1 series · 4 of 4 positions shown · non-contrast
Comparison: none

INDICATION: 54-year-old male with abscess formation posterior to the liver in
the subdiaphragmatic space following percutaneous cholecystectomy.
He presents for image guided aspiration.

[Series 1: us fine needle asp 1st lesion · 4 of 4 slices shown]
[im 1/4]
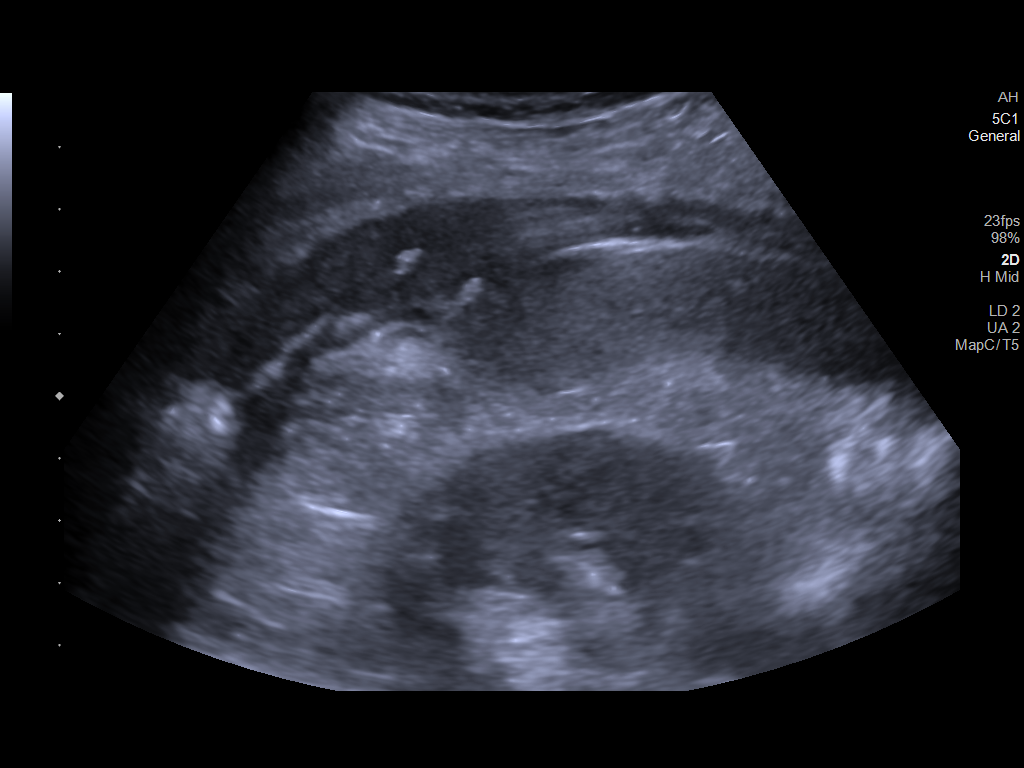
[im 2/4]
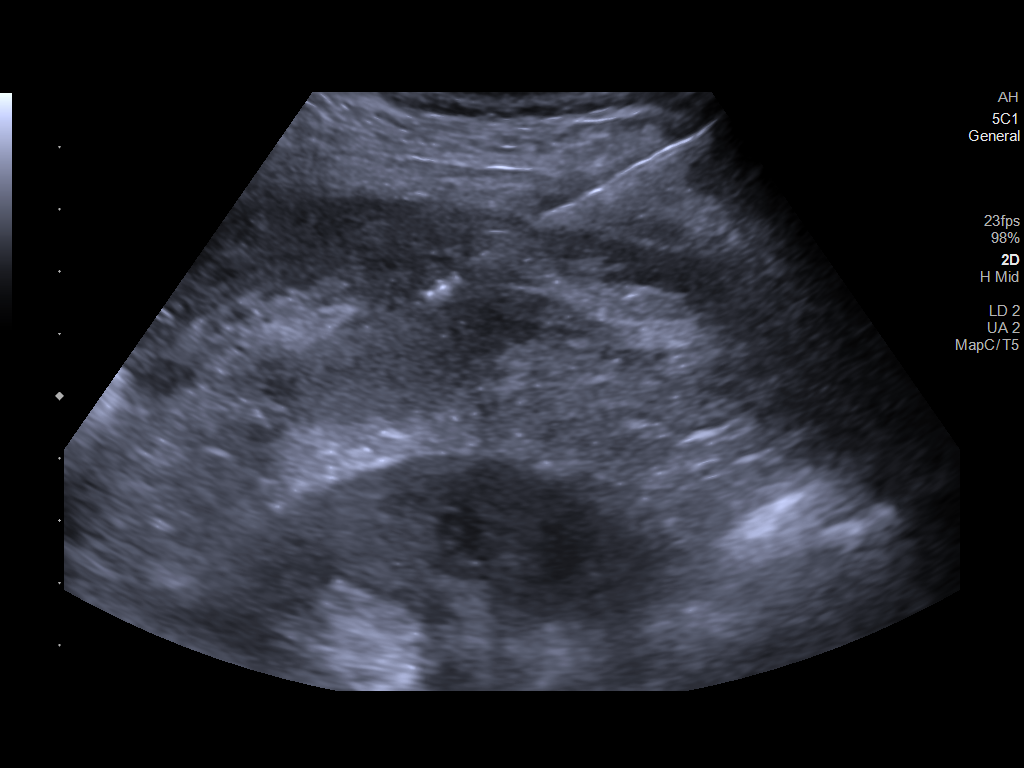
[im 3/4]
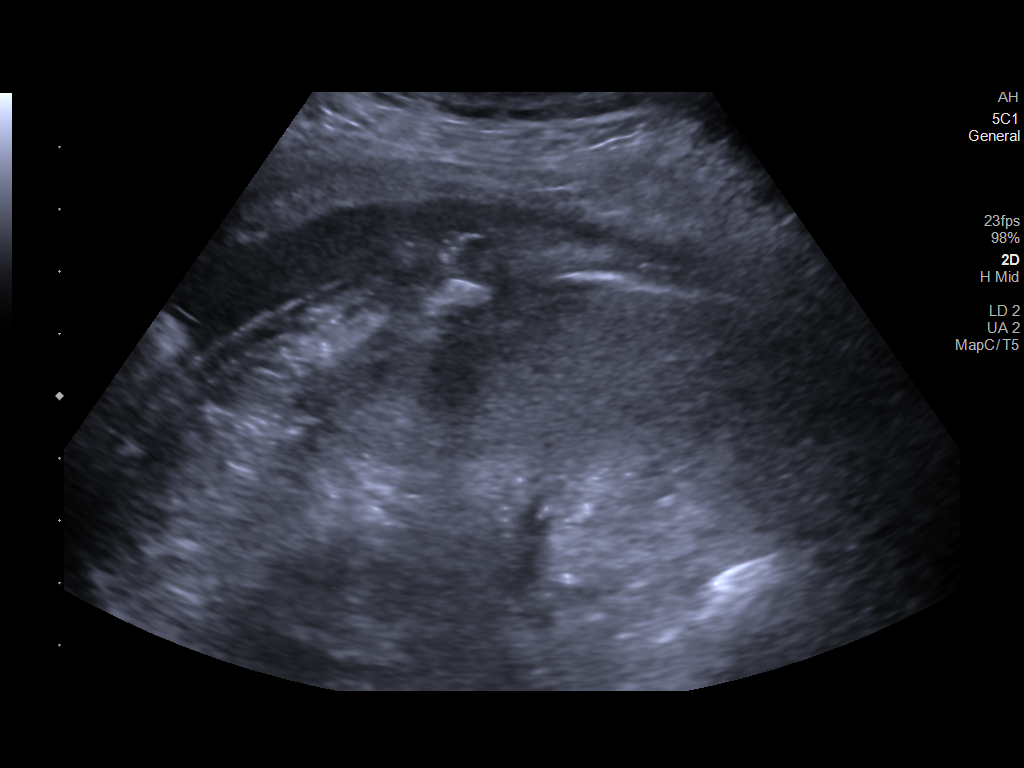
[im 4/4]
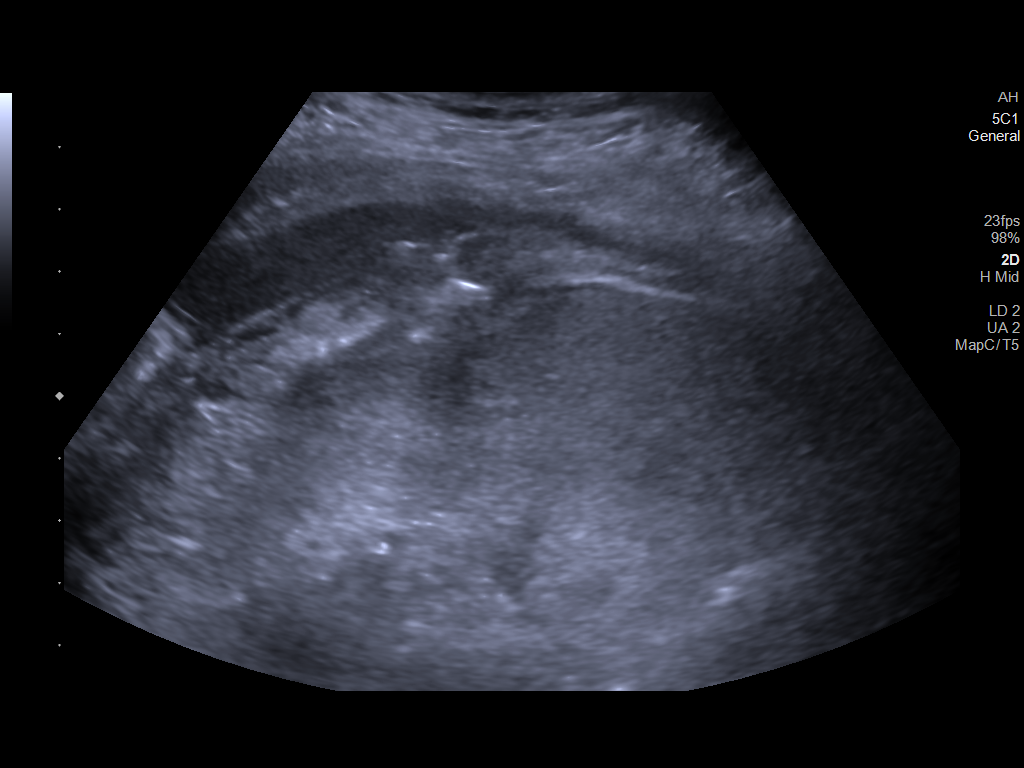

[4 of 4 positions shown; findings below may reference images not displayed]

EXAM:
Ultrasound aspiration

MEDICATIONS:
None.

ANESTHESIA/SEDATION:
Fentanyl 100 mcg IV; Versed 2 mg IV

Moderate Sedation Time:  16 minutes

The patient was continuously monitored during the procedure by the
interventional radiology nurse under my direct supervision.

COMPLICATIONS:
None immediate.

PROCEDURE:
Informed written consent was obtained from the patient after a
thorough discussion of the procedural risks, benefits and
alternatives. All questions were addressed. Maximal Sterile Barrier
Technique was utilized including caps, mask, sterile gowns, sterile
gloves, sterile drape, hand hygiene and skin antiseptic. A timeout
was performed prior to the initiation of the procedure.

The region of interest was interrogated with ultrasound.
Inflammatory phlegmon is present within the inter costal space
posterior to the liver and just caudal to the pleural space. There
are 2 tiny hypoechoic fluid collections with central echogenic foci.
These were targeted for aspiration.

The overlying skin was sterilely prepped and draped in the standard
fashion using chlorhexidine skin prep. Local anesthesia was attained
by infiltration with 1% lidocaine.

An 18 gauge trocar needle was then advanced into the fluid
collection. Aspiration yields approximately 2 mL thick, purulent
bloody fluid. Additional aspiration was performed of the second
smaller collection yielding scant fluid. The needle was removed.
Aspirated sample was sent for Gram stain and culture. The patient
tolerated the procedure well.
IMPRESSION: 1. Very small amount of drainable fluid in the region of clinical
concern which was predominantly phlegmon.
2. Successful aspiration of approximately 2 mL thick, purulent
bloody fluid which was sent for Gram stain and culture.

## 2024-02-01 ENCOUNTER — Other Ambulatory Visit (HOSPITAL_BASED_OUTPATIENT_CLINIC_OR_DEPARTMENT_OTHER): Payer: Self-pay | Admitting: Nurse Practitioner

## 2024-02-01 DIAGNOSIS — Z9189 Other specified personal risk factors, not elsewhere classified: Secondary | ICD-10-CM

## 2024-02-21 ENCOUNTER — Ambulatory Visit (HOSPITAL_BASED_OUTPATIENT_CLINIC_OR_DEPARTMENT_OTHER)
Admission: RE | Admit: 2024-02-21 | Discharge: 2024-02-21 | Disposition: A | Discharge status: Home / Self Care | Payer: Self-pay | Source: Ambulatory Visit | Attending: Nurse Practitioner | Admitting: Nurse Practitioner

## 2024-02-21 DIAGNOSIS — Z9189 Other specified personal risk factors, not elsewhere classified: Secondary | ICD-10-CM | POA: Insufficient documentation
